# Patient Record
Sex: Female | Born: 1995 | Race: Black or African American | Hispanic: No | Smoking: Current some day smoker
Health system: Southern US, Community
[De-identification: ages and names within clinical notes are randomized; demographics above are authoritative.]

## PROBLEM LIST (undated history)

## (undated) ENCOUNTER — Inpatient Hospital Stay (HOSPITAL_COMMUNITY): Payer: Self-pay

## (undated) DIAGNOSIS — R519 Headache, unspecified: Secondary | ICD-10-CM

## (undated) DIAGNOSIS — J302 Other seasonal allergic rhinitis: Secondary | ICD-10-CM

## (undated) DIAGNOSIS — L309 Dermatitis, unspecified: Secondary | ICD-10-CM

## (undated) DIAGNOSIS — R51 Headache: Secondary | ICD-10-CM

## (undated) HISTORY — PX: HAND SURGERY: SHX662

---
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
## 2017-07-11 ENCOUNTER — Encounter (HOSPITAL_COMMUNITY): Payer: Self-pay | Admitting: Emergency Medicine

## 2017-07-11 ENCOUNTER — Ambulatory Visit (HOSPITAL_COMMUNITY)
Admission: EM | Admit: 2017-07-11 | Discharge: 2017-07-11 | Disposition: A | Discharge status: Home / Self Care | Payer: Medicaid Other | Attending: Family Medicine | Admitting: Family Medicine

## 2017-07-11 ENCOUNTER — Other Ambulatory Visit: Payer: Self-pay

## 2017-07-11 DIAGNOSIS — S80262A Insect bite (nonvenomous), left knee, initial encounter: Secondary | ICD-10-CM | POA: Diagnosis not present

## 2017-07-11 DIAGNOSIS — W57XXXA Bitten or stung by nonvenomous insect and other nonvenomous arthropods, initial encounter: Secondary | ICD-10-CM | POA: Diagnosis not present

## 2017-07-11 MED ORDER — MUPIROCIN CALCIUM 2 % NA OINT: TOPICAL_OINTMENT | NASAL | 0 refills | Status: DC

## 2017-07-11 NOTE — ED Provider Notes (Signed)
[facility]    CSN: [id number] Arrival date & time: 07/11/17  1919     History   Chief Complaint Chief Complaint  Patient presents with  . Knee Pain    HPI Stacy Mcgee is a 22 y.o. female.   Patient has localized swelling on the l dorsal lateral aspect of her left knee.  She suspects it is some kind of blighted I would agree  HPI  Past Medical History:  Diagnosis Date  . Asthma   . Eczema   . Generalized headaches   . Seasonal allergies     Patient Active Problem List   Diagnosis Date Noted  . Migraine without aura and without status migrainosus, not intractable 03/04/2015  . Tension headache 03/04/2015  . Sleeping difficulty 03/04/2015    History reviewed. No pertinent surgical history.  OB History   None      Home Medications    Prior to Admission medications   Medication Sig Start Date End Date Taking? Authorizing Provider  albuterol (PROVENTIL HFA;VENTOLIN HFA) 108 (90 BASE) MCG/ACT inhaler Inhale 2 puffs into the lungs every 6 (six) hours as needed for wheezing or shortness of breath.     [provider]  ibuprofen (ADVIL,MOTRIN) 800 MG tablet Take 1 tablet (800 mg total) by mouth every 8 (eight) hours as needed for mild pain or moderate pain. 05/17/16   Jacalyn Lefevre, MD  metroNIDAZOLE (FLAGYL) 500 MG tablet Take 1 tablet (500 mg total) by mouth 2 (two) times daily. 05/17/16   Jacalyn Lefevre, MD  oxyCODONE-acetaminophen (PERCOCET/ROXICET) 5-325 MG tablet Take 1 tablet by mouth every 6 (six) hours as needed for severe pain. 12/01/15   Lawyer, Cristal Deer, PA-C  triamcinolone ointment (KENALOG) 0.1 % Apply 1 application topically 2 (two) times daily as needed (for eczema).    [provider]    Family History Family History  Problem Relation Age of Onset  . Bipolar disorder Mother   . Depression Mother   . Migraines Father   . ADD / ADHD Sister   . Migraines Maternal Aunt   . Cancer Maternal Grandfather   . Heart  Problems Paternal Grandfather     Social History Social History   Tobacco Use  . Smoking status: Never Smoker  . Smokeless tobacco: Never Used  Substance Use Topics  . Alcohol use: No  . Drug use: No     Allergies   Peanut-containing drug products   Review of Systems Review of Systems  Constitutional: Negative for chills and fever.  HENT: Negative for ear pain and sore throat.   Eyes: Negative for pain and visual disturbance.  Respiratory: Negative for cough and shortness of breath.   Cardiovascular: Negative for chest pain and palpitations.  Gastrointestinal: Negative for abdominal pain and vomiting.  Genitourinary: Negative for dysuria and hematuria.  Musculoskeletal: Negative for arthralgias and back pain.  Skin: Positive for rash. Negative for color change.  Neurological: Negative for seizures and syncope.  All other systems reviewed and are negative.    Physical Exam Triage Vital Signs ED Triage Vitals  Enc Vitals Group     BP 07/11/17 2013 118/65     Pulse Rate 07/11/17 2013 79     Resp 07/11/17 2013 18     Temp 07/11/17 2013 98.2 F (36.8 C)     Temp Source 07/11/17 2013 Oral     SpO2 07/11/17 2013 100 %     Weight --      Height --  Head Circumference --      Peak Flow --      Pain Score 07/11/17 2011 7     Pain Loc --      Pain Edu? --      Excl. in GC? --    No data found.  Updated Vital Signs BP 118/65 (BP Location: Left Arm)   Pulse 79   Temp 98.2 F (36.8 C) (Oral)   Resp 18   SpO2 100%   Visual Acuity Right Eye Distance:   Left Eye Distance:   Bilateral Distance:    Right Eye Near:   Left Eye Near:    Bilateral Near:     Physical Exam  Constitutional: She appears well-developed and well-nourished.  Skin:  Localized swelling dorsal lateral knee tender to touch there is no drainage it does feel firm and not fluctuant.  I suspect this represents a spider bite     UC Treatments / Results  Labs (all labs ordered are  listed, but only abnormal results are displayed) Labs Reviewed - No data to display  EKG None Radiology No results found.  Procedures Procedures (including critical care time)  Medications Ordered in UC Medications - No data to display   Initial Impression / Assessment and Plan / UC Course  I have reviewed the triage vital signs and the nursing notes.  Pertinent labs & imaging results that were available during my care of the patient were reviewed by me and considered in my medical decision making (see chart for details).     Insect bite probable spider left knee will treat with topical antibiotic and have instructed her to use ice packs to reduce swelling  Final Clinical Impressions(s) / UC Diagnoses   Final diagnoses:  None    ED Discharge Orders    None       Controlled Substance Prescriptions [place] Controlled Substance Registry consulted? No   Frederica KusterMiller, Michella Detjen M, MD 07/11/17 2029

## 2017-07-11 NOTE — ED Triage Notes (Signed)
Swollen area to the lateral left knee/abscess.  Area first noticed 2 days ago

## 2017-09-15 ENCOUNTER — Encounter (HOSPITAL_COMMUNITY): Payer: Self-pay | Admitting: Emergency Medicine

## 2017-09-15 ENCOUNTER — Emergency Department (HOSPITAL_COMMUNITY): Payer: Medicaid Other

## 2017-09-15 ENCOUNTER — Emergency Department (HOSPITAL_COMMUNITY)
Admission: EM | Admit: 2017-09-15 | Discharge: 2017-09-15 | Disposition: A | Discharge status: Home / Self Care | Payer: Medicaid Other | Attending: Emergency Medicine | Admitting: Emergency Medicine

## 2017-09-15 DIAGNOSIS — Z79899 Other long term (current) drug therapy: Secondary | ICD-10-CM | POA: Insufficient documentation

## 2017-09-15 DIAGNOSIS — R51 Headache: Secondary | ICD-10-CM | POA: Diagnosis present

## 2017-09-15 DIAGNOSIS — Z91010 Allergy to peanuts: Secondary | ICD-10-CM | POA: Diagnosis not present

## 2017-09-15 DIAGNOSIS — G43909 Migraine, unspecified, not intractable, without status migrainosus: Secondary | ICD-10-CM | POA: Insufficient documentation

## 2017-09-15 DIAGNOSIS — J45909 Unspecified asthma, uncomplicated: Secondary | ICD-10-CM | POA: Insufficient documentation

## 2017-09-15 LAB — CBC
HCT: 41.2 % (ref 36.0–46.0)
Hemoglobin: 13.4 g/dL (ref 12.0–15.0)
MCH: 29.6 pg (ref 26.0–34.0)
MCHC: 32.5 g/dL (ref 30.0–36.0)
MCV: 90.9 fL (ref 78.0–100.0)
PLATELETS: 320 10*3/uL (ref 150–400)
RBC: 4.53 MIL/uL (ref 3.87–5.11)
RDW: 13.3 % (ref 11.5–15.5)
WBC: 7.8 10*3/uL (ref 4.0–10.5)

## 2017-09-15 LAB — BASIC METABOLIC PANEL
Anion gap: 8 (ref 5–15)
BUN: 8 mg/dL (ref 6–20)
CHLORIDE: 105 mmol/L (ref 101–111)
CO2: 25 mmol/L (ref 22–32)
CREATININE: 0.82 mg/dL (ref 0.44–1.00)
Calcium: 9.3 mg/dL (ref 8.9–10.3)
GFR calc Af Amer: >60 mL/min (ref >60)
Glucose, Bld: 86 mg/dL (ref 65–99)
Potassium: 3.4 mmol/L — ABNORMAL LOW (ref 3.5–5.1)
SODIUM: 138 mmol/L (ref 135–145)

## 2017-09-15 LAB — I-STAT BETA HCG BLOOD, ED (MC, WL, AP ONLY): I-stat hCG, quantitative: <5 m[IU]/mL (ref <5)

## 2017-09-15 MED ORDER — DEXAMETHASONE SODIUM PHOSPHATE 4 MG/ML IJ SOLN
4.0000 mg | Freq: Once | INTRAMUSCULAR | Status: AC
  Administered 2017-09-15: 4 mg via INTRAVENOUS
  Filled 2017-09-15: qty 1

## 2017-09-15 MED ORDER — ACETAMINOPHEN 500 MG PO TABS
1000.0000 mg | ORAL_TABLET | Freq: Once | ORAL | Status: AC
  Administered 2017-09-15: 1000 mg via ORAL
  Filled 2017-09-15: qty 2

## 2017-09-15 MED ORDER — SODIUM CHLORIDE 0.9 % IV BOLUS
1000.0000 mL | Freq: Once | INTRAVENOUS | Status: AC
  Administered 2017-09-15: 1000 mL via INTRAVENOUS

## 2017-09-15 MED ORDER — PROMETHAZINE HCL 25 MG/ML IJ SOLN
25.0000 mg | Freq: Once | INTRAMUSCULAR | Status: AC
  Administered 2017-09-15: 25 mg via INTRAVENOUS
  Filled 2017-09-15: qty 1

## 2017-09-15 MED ORDER — LORAZEPAM 2 MG/ML IJ SOLN: 0.5000 mg | INTRAMUSCULAR | Status: DC

## 2017-09-15 NOTE — ED Provider Notes (Signed)
[facility] Provider Note   CSN: [id number] Arrival date & time: 09/15/17  1753     History   Chief Complaint Chief Complaint  Patient presents with  . Migraine    HPI Stacy Mcgee is a 22 y.o. female with a past medical history of migraine headaches who presents today for evaluation of bilateral frontal headache/migraine with nausea.  This started while she was at work, she was not doing any particular strenuous or stressful activity, denies any trauma.  She was not bearing down, coughing, sneezing.  She reports that it rapidly worsened.  She reports that she has had migraines in the past however they feel different.  She has 9 out of 10 pain in her head.  She endorses shortness of breath but has been present since her head started hurting.  She reports feeling anxious.  HPI  Past Medical History:  Diagnosis Date  . Asthma   . Eczema   . Generalized headaches   . Seasonal allergies     Patient Active Problem List   Diagnosis Date Noted  . Migraine without aura and without status migrainosus, not intractable 03/04/2015  . Tension headache 03/04/2015  . Sleeping difficulty 03/04/2015    No past surgical history on file.   OB History   None      Home Medications    Prior to Admission medications   Medication Sig Start Date End Date Taking? Authorizing Provider  albuterol (PROVENTIL HFA;VENTOLIN HFA) 108 (90 BASE) MCG/ACT inhaler Inhale 2 puffs into the lungs every 6 (six) hours as needed for wheezing or shortness of breath.     [provider]  ibuprofen (ADVIL,MOTRIN) 800 MG tablet Take 1 tablet (800 mg total) by mouth every 8 (eight) hours as needed for mild pain or moderate pain. 05/17/16   Jacalyn LefevreHaviland, Julie, MD  metroNIDAZOLE (FLAGYL) 500 MG tablet Take 1 tablet (500 mg total) by mouth 2 (two) times daily. 05/17/16   Jacalyn LefevreHaviland, Julie, MD  mupirocin nasal ointment (BACTROBAN) 2 % Apply in each nostril daily 07/11/17    Frederica KusterMiller, Stephen M, MD  oxyCODONE-acetaminophen (PERCOCET/ROXICET) 5-325 MG tablet Take 1 tablet by mouth every 6 (six) hours as needed for severe pain. 12/01/15   Lawyer, Cristal Deerhristopher, PA-C  triamcinolone ointment (KENALOG) 0.1 % Apply 1 application topically 2 (two) times daily as needed (for eczema).    [provider]    Family History Family History  Problem Relation Age of Onset  . Bipolar disorder Mother   . Depression Mother   . Migraines Father   . ADD / ADHD Sister   . Migraines Maternal Aunt   . Cancer Maternal Grandfather   . Heart Problems Paternal Grandfather     Social History Social History   Tobacco Use  . Smoking status: Never Smoker  . Smokeless tobacco: Never Used  Substance Use Topics  . Alcohol use: No  . Drug use: No     Allergies   Peanut-containing drug products   Review of Systems Review of Systems  Constitutional: Negative for chills and fever.  HENT: Negative for congestion, ear pain and sore throat.   Eyes: Positive for visual disturbance. Negative for pain.  Respiratory: Positive for shortness of breath. Negative for cough and chest tightness.   Cardiovascular: Negative for chest pain and palpitations.  Gastrointestinal: Negative for abdominal pain and vomiting.  Genitourinary: Negative for dysuria and hematuria.  Musculoskeletal: Negative for arthralgias, back pain, neck pain and neck stiffness.  Skin: Negative for color change and rash.  Neurological: Positive for headaches. Negative for seizures and syncope.  All other systems reviewed and are negative.    Physical Exam Updated Vital Signs BP (!) 108/51   Pulse 66   Temp 97.7 F (36.5 C)   Resp 17   LMP 07/16/2017   SpO2 100%   Physical Exam  Constitutional: She is oriented to person, place, and time. She appears well-developed and well-nourished. No distress.  HENT:  Head: Normocephalic and atraumatic.  Eyes: Conjunctivae are normal. Right eye exhibits no  discharge. Left eye exhibits no discharge. No scleral icterus.  Neck: Normal range of motion. Neck supple.  Cardiovascular: Normal rate, regular rhythm and normal heart sounds.  Pulmonary/Chest: Effort normal and breath sounds normal. No stridor. No respiratory distress. She has no wheezes. She has no rales.  Abdominal: She exhibits no distension.  Musculoskeletal: She exhibits no edema or deformity.  Neurological: She is alert and oriented to person, place, and time. She exhibits normal muscle tone.  Mental Status:  Alert, oriented, thought content appropriate, able to give a coherent history. Speech fluent without evidence of aphasia. Able to follow 2 step commands without difficulty.  Cranial Nerves:  II:  Peripheral visual fields grossly normal, pupils equal, round, reactive to light III,IV, VI: ptosis not present, extra-ocular motions intact bilaterally  V,VII: smile symmetric, facial light touch sensation equal VIII: hearing grossly normal to voice  X: uvula elevates symmetrically  XI: bilateral shoulder shrug symmetric and strong XII: midline tongue extension without fassiculations Motor:  Normal tone. 5/5 in upper and lower extremities bilaterally including strong and equal grip strength and dorsiflexion/plantar flexion Cerebellar: normal finger-to-nose with bilateral upper extremities, normal heal shin with bilateral lower extremities.  CV: distal pulses palpable throughout    Skin: Skin is warm and dry. She is not diaphoretic.  Psychiatric: Her mood appears anxious.  Tearful  Nursing note and vitals reviewed.    ED Treatments / Results  Labs (all labs ordered are listed, but only abnormal results are displayed) Labs Reviewed  BASIC METABOLIC PANEL - Abnormal; Notable for the following components:      Result Value   Potassium 3.4 (*)    All other components within normal limits  CBC  I-STAT BETA HCG BLOOD, ED ([facility], [facility], [facility] ONLY)    EKG None  Radiology Ct Head Wo  Contrast  Result Date: 09/15/2017 CLINICAL DATA:  Headache. EXAM: CT HEAD WITHOUT CONTRAST TECHNIQUE: Contiguous axial images were obtained from the base of the skull through the vertex without intravenous contrast. COMPARISON:  CT scan of January 02, 2015. FINDINGS: Brain: No evidence of acute infarction, hemorrhage, hydrocephalus, extra-axial collection or mass lesion/mass effect. Vascular: No hyperdense vessel or unexpected calcification. Skull: Normal. Negative for fracture or focal lesion. Sinuses/Orbits: No acute finding. Other: None. IMPRESSION: Normal head CT. Electronically Signed   By: Lupita Raider, M.D.   On: 09/15/2017 19:38    Procedures Procedures (including critical care time)  Medications Ordered in ED Medications  sodium chloride 0.9 % bolus 1,000 mL (0 mLs Intravenous Stopped 09/15/17 2009)  dexamethasone (DECADRON) injection 4 mg (4 mg Intravenous Given 09/15/17 1918)  promethazine (PHENERGAN) injection 25 mg (25 mg Intravenous Given 09/15/17 1915)  acetaminophen (TYLENOL) tablet 1,000 mg (1,000 mg Oral Given 09/15/17 1917)     Initial Impression / Assessment and Plan / ED Course  I have reviewed the triage vital signs and the nursing notes.  Pertinent labs & imaging results  that were available during my care of the patient were reviewed by me and considered in my medical decision making (see chart for details).  Clinical Course as of Sep 16 2023  Sun Sep 15, 2017  2018 Patient reevaluated, she reports her headache is now a 2 out of 10, she feels much better.  She reports that as her headache went away less anxious and her shortness of breath went away and has not fully resolved.  She was requesting discharge home at this time.   [EH]    Clinical Course User Index [EH] Cristina Gong, PA-C   Patient presents today for evaluation of a migraine headache.  She has a history of migraine headaches, however reports that this is different than usual, and she has not had  a migraine in multiple years.  She initially reported shortness of breath and feeling very anxious.  Suspect shortness of breath secondary to anxiety.  Discussed the role of CT scan with the patient who wished to proceed CT scan.  CT scan obtained without acute abnormalities.  She was treated with migraine cocktail of IV fluids, Phenergan, Decadron, Tylenol, after which she reported that her pain went down to a 2 and she was ready to go home.  She did report some slight blurred vision, however visual acuity was normal.  Shortness of breath fully resolved as her pain decreased.  Return precautions were discussed with patient states understanding.  PCP follow-up.   Final Clinical Impressions(s) / ED Diagnoses   Final diagnoses:  Migraine without status migrainosus, not intractable, unspecified migraine type    ED Discharge Orders    None       Norman Clay 09/15/17 2025    Vanetta Mulders, MD 09/18/17 [phone]

## 2017-09-15 NOTE — ED Notes (Signed)
Patient transported to CT 

## 2017-09-15 NOTE — ED Notes (Signed)
Pt ambulated to restroom without difficulty

## 2017-09-15 NOTE — ED Notes (Signed)
ED Provider at bedside. 

## 2017-09-15 NOTE — ED Triage Notes (Addendum)
Pt states she began having a migraine to bilateral front of head at work and behind eyes. Pt also reports nausea. Hx of migraines, has not had one in a while. Ambulatory. Pt tearful at triage. Also endorses shortness of breath, lung sounds are clear to all lobes. Pain 9/10 to head. Denies pain anywhere else. LMP 2 months ago.

## 2017-12-19 ENCOUNTER — Encounter (HOSPITAL_COMMUNITY): Payer: Self-pay

## 2017-12-19 ENCOUNTER — Other Ambulatory Visit: Payer: Self-pay

## 2017-12-19 ENCOUNTER — Emergency Department (HOSPITAL_COMMUNITY)
Admission: EM | Admit: 2017-12-19 | Discharge: 2017-12-19 | Disposition: A | Discharge status: Home / Self Care | Payer: Medicaid Other | Attending: Emergency Medicine | Admitting: Emergency Medicine

## 2017-12-19 DIAGNOSIS — L03011 Cellulitis of right finger: Secondary | ICD-10-CM | POA: Insufficient documentation

## 2017-12-19 DIAGNOSIS — Z91010 Allergy to peanuts: Secondary | ICD-10-CM | POA: Insufficient documentation

## 2017-12-19 DIAGNOSIS — J45909 Unspecified asthma, uncomplicated: Secondary | ICD-10-CM | POA: Insufficient documentation

## 2017-12-19 MED ORDER — AMOXICILLIN-POT CLAVULANATE 875-125 MG PO TABS: 1.0000 | ORAL_TABLET | Freq: Two times a day (BID) | ORAL | 0 refills | Status: DC

## 2017-12-19 MED ORDER — LIDOCAINE HCL (PF) 1 % IJ SOLN
10.0000 mL | Freq: Once | INTRAMUSCULAR | Status: AC
  Administered 2017-12-19: 10 mL via INTRADERMAL
  Filled 2017-12-19: qty 10

## 2017-12-19 NOTE — ED Notes (Signed)
Family at bedside. 

## 2017-12-19 NOTE — Discharge Instructions (Addendum)
Contact a health care provider if: °Your symptoms get worse or do not improve with treatment. °You have a fever or chills. °You have redness spreading from the affected area. °You have continued or increased fluid, blood, or pus coming from the affected area. °Your finger or knuckle becomes swollen or is difficult to move. °

## 2017-12-19 NOTE — ED Triage Notes (Signed)
Pt states hx of infection in fingers in past, presents with swelling to right ring finger, and right middle finger with some drainage. Pt does bite nails.

## 2017-12-19 NOTE — ED Provider Notes (Signed)
[facility] EMERGENCY DEPARTMENT Provider Note   CSN: [id number] Arrival date & time: 12/19/17  1800     History   Chief Complaint Chief Complaint  Patient presents with  . Hand Pain    HPI Stacy Mcgee is a 22 y.o. female.  Who presents for evaluation of finger pain.  Patient states that she had swelling and some pus drainage from her right ring finger.  She states that she has had this before in the past usually from biting her nails.  She is also starting to experience some in her middle finger on the right hand as well.  She denies fevers or chills.  Complains of throbbing nonradiating pain.  HPI  Past Medical History:  Diagnosis Date  . Asthma   . Eczema   . Generalized headaches   . Seasonal allergies     Patient Active Problem List   Diagnosis Date Noted  . Migraine without aura and without status migrainosus, not intractable 03/04/2015  . Tension headache 03/04/2015  . Sleeping difficulty 03/04/2015    History reviewed. No pertinent surgical history.   OB History   None      Home Medications    Prior to Admission medications   Medication Sig Start Date End Date Taking? Authorizing Provider  albuterol (PROVENTIL HFA;VENTOLIN HFA) 108 (90 BASE) MCG/ACT inhaler Inhale 2 puffs into the lungs every 6 (six) hours as needed for wheezing or shortness of breath.     [provider]  ibuprofen (ADVIL,MOTRIN) 800 MG tablet Take 1 tablet (800 mg total) by mouth every 8 (eight) hours as needed for mild pain or moderate pain. 05/17/16   Jacalyn Lefevre, MD  metroNIDAZOLE (FLAGYL) 500 MG tablet Take 1 tablet (500 mg total) by mouth 2 (two) times daily. 05/17/16   Jacalyn Lefevre, MD  mupirocin nasal ointment (BACTROBAN) 2 % Apply in each nostril daily 07/11/17   Frederica Kuster, MD  oxyCODONE-acetaminophen (PERCOCET/ROXICET) 5-325 MG tablet Take 1 tablet by mouth every 6 (six) hours as needed for severe pain. 12/01/15   Lawyer, Cristal Deer,  PA-C  triamcinolone ointment (KENALOG) 0.1 % Apply 1 application topically 2 (two) times daily as needed (for eczema).    [provider]    Family History Family History  Problem Relation Age of Onset  . Bipolar disorder Mother   . Depression Mother   . Migraines Father   . ADD / ADHD Sister   . Migraines Maternal Aunt   . Cancer Maternal Grandfather   . Heart Problems Paternal Grandfather     Social History Social History   Tobacco Use  . Smoking status: Never Smoker  . Smokeless tobacco: Never Used  Substance Use Topics  . Alcohol use: No  . Drug use: No     Allergies   Peanut-containing drug products   Review of Systems Review of Systems  Constitutional: Negative for chills and fever.  Musculoskeletal: Negative for joint swelling.  Skin: Negative for wound.       Swelling and erythema of the finger  Neurological: Negative for numbness.     Physical Exam Updated Vital Signs BP 116/70 (BP Location: Right Arm)   Pulse 88   Temp 98.3 F (36.8 C) (Oral)   Resp 18   Ht 5\' 3"  (1.6 m)   Wt 81.6 kg   SpO2 100%   BMI 31.89 kg/m   Physical Exam  Constitutional: She is oriented to person, place, and time. She appears well-developed and well-nourished.  No distress.  HENT:  Head: Normocephalic and atraumatic.  Eyes: Conjunctivae are normal. No scleral icterus.  Neck: Normal range of motion.  Cardiovascular: Normal rate, regular rhythm and normal heart sounds. Exam reveals no gallop and no friction rub.  No murmur heard. Pulmonary/Chest: Effort normal and breath sounds normal. No respiratory distress.  Abdominal: Soft. Bowel sounds are normal. She exhibits no distension and no mass. There is no tenderness. There is no guarding.  Musculoskeletal:  Right ring finger with exquisite tenderness and fluctuance on the ulnar side of the nail margin  Neurological: She is alert and oriented to person, place, and time.  Skin: Skin is warm and dry. She is not  diaphoretic.  Psychiatric: Her behavior is normal.  Nursing note and vitals reviewed.    ED Treatments / Results  Labs (all labs ordered are listed, but only abnormal results are displayed) Labs Reviewed - No data to display  EKG None  Radiology No results found.  Procedures Procedures (including critical care time) INCISION AND DRAINAGE Performed by: Arthor Captain Consent: Verbal consent obtained. Risks and benefits: risks, benefits and alternatives were discussed Type: abscess  Body area: R ring finger  Anesthesia: local infiltration  Incision was made with a scalpel.  Digital block: lidocaine 1% without epinephrine  Anesthetic total: 8 ml  Complexity: complex Blunt dissection to break up loculations  Drainage: purulent  Drainage amount: moderate   Patient tolerance: Patient tolerated the procedure well with no immediate complications.    Medications Ordered in ED Medications  lidocaine (PF) (XYLOCAINE) 1 % injection 10 mL (10 mLs Intradermal Given 12/19/17 2105)     Initial Impression / Assessment and Plan / ED Course  I have reviewed the triage vital signs and the nursing notes.  Pertinent labs & imaging results that were available during my care of the patient were reviewed by me and considered in my medical decision making (see chart for details).     Patient with paronychia amenable to incision and drainage.    Encouraged home warm soaks and flushing.  No signs of cellulitis is surrounding skin.  No evidence of felon or infectious tenosynovitis.  Will d/c to home.  No antibiotic therapy is indicated.   Final Clinical Impressions(s) / ED Diagnoses   Final diagnoses:  None    ED Discharge Orders    None       Arthor Captain, PA-C 12/20/17 0046    Mancel Bale, MD 12/20/17 1500

## 2018-02-03 ENCOUNTER — Emergency Department (HOSPITAL_COMMUNITY): Payer: Medicaid Other

## 2018-02-03 ENCOUNTER — Other Ambulatory Visit: Payer: Self-pay

## 2018-02-03 ENCOUNTER — Emergency Department (HOSPITAL_COMMUNITY)
Admission: EM | Admit: 2018-02-03 | Discharge: 2018-02-03 | Disposition: A | Discharge status: Home / Self Care | Payer: Medicaid Other | Attending: Emergency Medicine | Admitting: Emergency Medicine

## 2018-02-03 ENCOUNTER — Encounter (HOSPITAL_COMMUNITY): Payer: Self-pay | Admitting: Neurology

## 2018-02-03 DIAGNOSIS — G43909 Migraine, unspecified, not intractable, without status migrainosus: Secondary | ICD-10-CM

## 2018-02-03 DIAGNOSIS — Z79899 Other long term (current) drug therapy: Secondary | ICD-10-CM | POA: Insufficient documentation

## 2018-02-03 DIAGNOSIS — F17210 Nicotine dependence, cigarettes, uncomplicated: Secondary | ICD-10-CM | POA: Insufficient documentation

## 2018-02-03 DIAGNOSIS — R079 Chest pain, unspecified: Secondary | ICD-10-CM | POA: Insufficient documentation

## 2018-02-03 DIAGNOSIS — J45909 Unspecified asthma, uncomplicated: Secondary | ICD-10-CM | POA: Insufficient documentation

## 2018-02-03 LAB — BASIC METABOLIC PANEL
ANION GAP: 8 (ref 5–15)
BUN: 8 mg/dL (ref 6–20)
CALCIUM: 9.1 mg/dL (ref 8.9–10.3)
CO2: 25 mmol/L (ref 22–32)
Chloride: 104 mmol/L (ref 98–111)
Creatinine, Ser: 0.75 mg/dL (ref 0.44–1.00)
Glucose, Bld: 84 mg/dL (ref 70–99)
Potassium: 3.6 mmol/L (ref 3.5–5.1)
Sodium: 137 mmol/L (ref 135–145)

## 2018-02-03 LAB — I-STAT BETA HCG BLOOD, ED (MC, WL, AP ONLY)

## 2018-02-03 LAB — CBC
HCT: 43.5 % (ref 36.0–46.0)
Hemoglobin: 13.4 g/dL (ref 12.0–15.0)
MCH: 28 pg (ref 26.0–34.0)
MCHC: 30.8 g/dL (ref 30.0–36.0)
MCV: 90.8 fL (ref 80.0–100.0)
NRBC: 0 % (ref 0.0–0.2)
PLATELETS: 423 10*3/uL — AB (ref 150–400)
RBC: 4.79 MIL/uL (ref 3.87–5.11)
RDW: 13.9 % (ref 11.5–15.5)
WBC: 7.7 10*3/uL (ref 4.0–10.5)

## 2018-02-03 LAB — I-STAT TROPONIN, ED: TROPONIN I, POC: 0 ng/mL (ref 0.00–0.08)

## 2018-02-03 MED ORDER — KETOROLAC TROMETHAMINE 30 MG/ML IJ SOLN
30.0000 mg | Freq: Once | INTRAMUSCULAR | Status: AC
  Administered 2018-02-03: 30 mg via INTRAVENOUS
  Filled 2018-02-03: qty 1

## 2018-02-03 MED ORDER — DIPHENHYDRAMINE HCL 50 MG/ML IJ SOLN
25.0000 mg | Freq: Once | INTRAMUSCULAR | Status: AC
  Administered 2018-02-03: 25 mg via INTRAVENOUS
  Filled 2018-02-03: qty 1

## 2018-02-03 MED ORDER — DEXAMETHASONE SODIUM PHOSPHATE 10 MG/ML IJ SOLN
10.0000 mg | Freq: Once | INTRAMUSCULAR | Status: AC
  Administered 2018-02-03: 10 mg via INTRAVENOUS
  Filled 2018-02-03: qty 1

## 2018-02-03 MED ORDER — METOCLOPRAMIDE HCL 5 MG/ML IJ SOLN
10.0000 mg | Freq: Once | INTRAMUSCULAR | Status: AC
  Administered 2018-02-03: 10 mg via INTRAVENOUS
  Filled 2018-02-03: qty 2

## 2018-02-03 NOTE — ED Triage Notes (Addendum)
Pt reports h/a and chest tightness since she woke up this morning at 8 am. Has hx of migraines and asthma. H/a is similar to h/a in past, and pain is behind her eyes. Has been a month since last h/a. She took ibuprofen this morning.

## 2018-02-03 NOTE — ED Notes (Signed)
Patient left at this time with all belongings. 

## 2018-02-03 NOTE — Discharge Instructions (Signed)
Follow-up with your primary care provider.  Return to ER for severe concerning symptoms.

## 2018-02-03 NOTE — ED Provider Notes (Signed)
[facility] EMERGENCY DEPARTMENT Provider Note   CSN: [id number] Arrival date & time: 02/03/18  1031     History   Chief Complaint Chief Complaint  Patient presents with  . Headache  . Chest Pain    HPI AIZZA SANTIAGO is a 22 y.o. female.  22 year old female presents with complaint of headache and chest pressure.  Patient states her symptoms started at 6 AM this morning upon waking, headache is located behind her eyes, similar to previous headaches, last headache similar to this was 1 month ago.  Patient states that her chest feels tight, does not feel like prior asthma symptoms, denies cough, congestion, wheezing.  Patient is out of her albuterol inhaler and has not tried using this today.  Chest tightness is constant, worse with lying on her right side, no change with exertion. Patient is a daily smoker, no history of HTN/cholesterol/DM, no family cardiac history. No other complaints or concerns.      Past Medical History:  Diagnosis Date  . Asthma   . Eczema   . Generalized headaches   . Seasonal allergies     Patient Active Problem List   Diagnosis Date Noted  . Migraine without aura and without status migrainosus, not intractable 03/04/2015  . Tension headache 03/04/2015  . Sleeping difficulty 03/04/2015    History reviewed. No pertinent surgical history.   OB History   None      Home Medications    Prior to Admission medications   Medication Sig Start Date End Date Taking? Authorizing Provider  ibuprofen (ADVIL,MOTRIN) 800 MG tablet Take 1 tablet (800 mg total) by mouth every 8 (eight) hours as needed for mild pain or moderate pain. Patient taking differently: Take 600 mg by mouth every 8 (eight) hours as needed for mild pain or moderate pain.  05/17/16  Yes Jacalyn Lefevre, MD    Family History Family History  Problem Relation Age of Onset  . Bipolar disorder Mother   . Depression Mother   . Migraines Father   . ADD / ADHD  Sister   . Migraines Maternal Aunt   . Cancer Maternal Grandfather   . Heart Problems Paternal Grandfather     Social History Social History   Tobacco Use  . Smoking status: Current Every Day Smoker  . Smokeless tobacco: Never Used  Substance Use Topics  . Alcohol use: No  . Drug use: No     Allergies   Peanut-containing drug products   Review of Systems Review of Systems  Constitutional: Negative for chills, diaphoresis and fever.  HENT: Negative for congestion.   Eyes: Negative for visual disturbance.  Respiratory: Positive for chest tightness. Negative for cough, shortness of breath and wheezing.   Cardiovascular: Positive for chest pain. Negative for palpitations and leg swelling.  Gastrointestinal: Negative for abdominal pain, nausea and vomiting.  Musculoskeletal: Negative for back pain and neck pain.  Skin: Negative for rash and wound.  Allergic/Immunologic: Negative for immunocompromised state.  Neurological: Positive for headaches. Negative for weakness.  Psychiatric/Behavioral: Negative for confusion.  All other systems reviewed and are negative.    Physical Exam Updated Vital Signs BP 116/67   Pulse 61   Temp 98.2 F (36.8 C) (Oral)   Resp 20   Ht 5\' 3"  (1.6 m)   Wt 77.6 kg   LMP 01/14/2018   SpO2 100%   BMI 30.29 kg/m   Physical Exam  Constitutional: She is oriented to person, place, and time. She appears  well-developed and well-nourished. No distress.  HENT:  Head: Normocephalic and atraumatic.  Eyes: Pupils are equal, round, and reactive to light. EOM are normal.  Neck: Neck supple.  Cardiovascular: Normal rate, regular rhythm, normal heart sounds and intact distal pulses.  No murmur heard. Pulmonary/Chest: Effort normal and breath sounds normal. She has no decreased breath sounds. She exhibits tenderness.    Abdominal: Soft. There is no tenderness.  Neurological: She is alert and oriented to person, place, and time. She has normal  strength. GCS eye subscore is 4. GCS verbal subscore is 5. GCS motor subscore is 6.  Skin: Skin is warm and dry. She is not diaphoretic.  Psychiatric: She has a normal mood and affect. Her behavior is normal.  Nursing note and vitals reviewed.    ED Treatments / Results  Labs (all labs ordered are listed, but only abnormal results are displayed) Labs Reviewed  CBC - Abnormal; Notable for the following components:      Result Value   Platelets 423 (*)    All other components within normal limits  BASIC METABOLIC PANEL  I-STAT TROPONIN, ED  I-STAT BETA HCG BLOOD, ED ([facility], [facility], [facility] ONLY)    EKG EKG Interpretation  Date/Time:  Monday February 03 2018 10:38:40 EST Ventricular Rate:  64 PR Interval:    QRS Duration: 98 QT Interval:  412 QTC Calculation: 426 R Axis:   93 Text Interpretation:  Sinus arrhythmia Borderline right axis deviation no prior available for comparison Confirmed by Tilden Fossa [phone]) on 02/03/2018 10:41:05 AM   Radiology Dg Chest Port 1 View  Result Date: 02/03/2018 CLINICAL DATA:  Migraine headache, nausea, shortness of breath beginning early today. History of asthma. EXAM: PORTABLE CHEST 1 VIEW COMPARISON:  Portable chest x-ray of July 22, 2013 FINDINGS: The lungs are adequately inflated. There is no focal infiltrate. There is no pleural effusion. The heart is top-normal in size. The pulmonary vascularity is normal. The bony thorax exhibits no acute abnormality. IMPRESSION: There is no active cardiopulmonary disease. Electronically Signed   By: David  [place] M.D.   On: 02/03/2018 11:22    Procedures Procedures (including critical care time)  Medications Ordered in ED Medications  ketorolac (TORADOL) 30 MG/ML injection 30 mg (30 mg Intravenous Given 02/03/18 1234)  metoCLOPramide (REGLAN) injection 10 mg (10 mg Intravenous Given 02/03/18 1235)  dexamethasone (DECADRON) injection 10 mg (10 mg Intravenous Given 02/03/18 1241)  diphenhydrAMINE  (BENADRYL) injection 25 mg (25 mg Intravenous Given 02/03/18 1238)     Initial Impression / Assessment and Plan / ED Course  I have reviewed the triage vital signs and the nursing notes.  Pertinent labs & imaging results that were available during my care of the patient were reviewed by me and considered in my medical decision making (see chart for details).  Clinical Course as of Feb 04 1312  Mon Feb 03, 2018  6210 22 year old female presents with complaint of headache typical of her previous migraines as well as chest tightness.  On exam patient had palpation of her sternum, exam otherwise unremarkable.  EKG unremarkable, CBC, BMP, troponin and hCG all within normal limits are negative.  Chest x-ray unremarkable.  Patient states her chest tightness has resolved, continues to have her migraine.  Medication ordered for her migraine.   [LM]  1312 Migraine cocktail given, patient reports her symptoms have totally resolved and she is ready for discharge at this time.  Advised patient to follow-up with your primary care provider, return to ER  for any severe concerning symptoms.  Offered to refill patient's albuterol inhaler as she previously stated she is out.  Patient states that she does have a few puffs left on her inhaler and does have refills available.   [LM]    Clinical Course User Index [LM] Jeannie Fend, PA-C   Final Clinical Impressions(s) / ED Diagnoses   Final diagnoses:  Migraine without status migrainosus, not intractable, unspecified migraine type  Nonspecific chest pain    ED Discharge Orders    None       Jeannie Fend, PA-C 02/03/18 1313    Tilden Fossa, MD 02/04/18 [phone]

## 2018-05-20 ENCOUNTER — Encounter (HOSPITAL_COMMUNITY): Payer: Self-pay

## 2018-05-20 ENCOUNTER — Ambulatory Visit (HOSPITAL_COMMUNITY)
Admission: EM | Admit: 2018-05-20 | Discharge: 2018-05-20 | Disposition: A | Discharge status: Home / Self Care | Payer: Self-pay | Attending: Family Medicine | Admitting: Family Medicine

## 2018-05-20 ENCOUNTER — Ambulatory Visit (INDEPENDENT_AMBULATORY_CARE_PROVIDER_SITE_OTHER): Payer: Self-pay

## 2018-05-20 DIAGNOSIS — R0989 Other specified symptoms and signs involving the circulatory and respiratory systems: Secondary | ICD-10-CM

## 2018-05-20 DIAGNOSIS — R05 Cough: Secondary | ICD-10-CM

## 2018-05-20 DIAGNOSIS — J4521 Mild intermittent asthma with (acute) exacerbation: Secondary | ICD-10-CM

## 2018-05-20 MED ORDER — PREDNISONE 20 MG PO TABS: 40.0000 mg | ORAL_TABLET | Freq: Every day | ORAL | 0 refills | Status: AC

## 2018-05-20 MED ORDER — ALBUTEROL SULFATE HFA 108 (90 BASE) MCG/ACT IN AERS: 1.0000 | INHALATION_SPRAY | Freq: Four times a day (QID) | RESPIRATORY_TRACT | 0 refills | Status: DC | PRN

## 2018-05-20 MED ORDER — BENZONATATE 100 MG PO CAPS: 100.0000 mg | ORAL_CAPSULE | Freq: Three times a day (TID) | ORAL | 0 refills | Status: DC

## 2018-05-20 NOTE — ED Triage Notes (Signed)
Pt presents with asthma flare up because of an upper respiratory cold she has; pt has complaint of non productive cough, chest tightness and shortness of breath X 7 days with the worse being between yesterday and today.

## 2018-05-20 NOTE — ED Provider Notes (Signed)
Patient: Stacy Mcgee MRN: [id number] DOB: [birth year] PCP: Iona Hansen, NP     Subjective:  Chief Complaint  Patient presents with  . Cough  . Chest Pain  . Shortness of Breath    HPI: The patient is a 23 y.o. female who presents today for cough/shortness of breath. She started out with a cold, but the past 5 days her cough has gotten worse and today her chest was tight and she decided to come in. She does have a history of asthma and only gets a flair when she gets a cold. She does have a rescue inhaler and has been needing it every 2 hours. She does not have a nebulizer. She has not had fever/chills. Her cough is productive in nature and has a yellow color to it. She has both shortness of breath and wheezing. Her little cousin was sick with a 24 hour fever, but no cold symptoms. She does smoke cigarettes. She has taken no other medication.   Lmp: 05/05/2018  Review of Systems  Constitutional: Negative for chills and fever.  HENT: Negative for congestion, ear pain, postnasal drip, rhinorrhea, sinus pressure, sinus pain and sore throat.   Respiratory: Positive for cough, chest tightness, shortness of breath and wheezing.   Cardiovascular: Negative for chest pain and palpitations.  Gastrointestinal: Negative for abdominal pain, nausea and vomiting.    Allergies Patient is allergic to peanut-containing drug products.  Past Medical History Patient  has a past medical history of Asthma, Eczema, Generalized headaches, and Seasonal allergies.  Surgical History Patient  has no past surgical history on file.  Family History Pateint's family history includes ADD / ADHD in her sister; Bipolar disorder in her mother; Cancer in her maternal grandfather; Depression in her mother; Heart Problems in her paternal grandfather; Migraines in her father and maternal aunt.  Social History Patient  reports that she has been smoking. She has never used smokeless tobacco. She reports that  she does not drink alcohol or use drugs.    Objective: Vitals:   05/20/18 1822  BP: 116/70  Pulse: 85  Resp: 16  Temp: 98.7 F (37.1 C)  TempSrc: Oral  SpO2: 100%    There is no height or weight on file to calculate BMI.  Physical Exam Vitals signs reviewed.  Constitutional:      Appearance: She is obese.     Comments: Tobacco abuse   Neck:     Musculoskeletal: Normal range of motion and neck supple.  Cardiovascular:     Rate and Rhythm: Normal rate and regular rhythm.     Heart sounds: Normal heart sounds. No systolic murmur.  Pulmonary:     Effort: Pulmonary effort is normal.     Breath sounds: Normal breath sounds. No wheezing, rhonchi or rales.  Lymphadenopathy:     Cervical: No cervical adenopathy.  Skin:    General: Skin is warm and dry.     Capillary Refill: Capillary refill takes less than 2 seconds.  Neurological:     General: No focal deficit present.     Mental Status: She is alert and oriented to person, place, and time.    CXR: normal     Assessment/plan: The encounter diagnosis was Mild intermittent asthma with acute exacerbation. -steroid burst, inhaler scheduled for next 1-2 days then prn and tessalon pearls prn. counseled her on importance of smoking cessation especially with history of asthma. She is to rest, push fluids. Any increasing shortness of breath, fever, decreased air movement,  retractions, nasal flaring she is to go to ER.     Orland Mustard, MD  05/20/2018    Orland Mustard, MD 05/20/18 [phone]

## 2018-09-08 ENCOUNTER — Other Ambulatory Visit: Payer: Self-pay

## 2018-09-08 ENCOUNTER — Ambulatory Visit (HOSPITAL_COMMUNITY)
Admission: EM | Admit: 2018-09-08 | Discharge: 2018-09-08 | Disposition: A | Discharge status: Home / Self Care | Payer: Medicaid Other | Attending: Family Medicine | Admitting: Family Medicine

## 2018-09-08 ENCOUNTER — Encounter (HOSPITAL_COMMUNITY): Payer: Self-pay

## 2018-09-08 DIAGNOSIS — M62838 Other muscle spasm: Secondary | ICD-10-CM

## 2018-09-08 MED ORDER — CYCLOBENZAPRINE HCL 10 MG PO TABS: 10.0000 mg | ORAL_TABLET | Freq: Two times a day (BID) | ORAL | 0 refills | Status: DC | PRN

## 2018-09-08 MED ORDER — KETOROLAC TROMETHAMINE 30 MG/ML IJ SOLN
INTRAMUSCULAR | Status: AC
  Filled 2018-09-08: qty 1

## 2018-09-08 MED ORDER — KETOROLAC TROMETHAMINE 30 MG/ML IJ SOLN
30.0000 mg | Freq: Once | INTRAMUSCULAR | Status: AC
  Administered 2018-09-08: 30 mg via INTRAMUSCULAR

## 2018-09-08 NOTE — Discharge Instructions (Addendum)
Take the Flexeril as needed for muscle spasm.  Be aware this medication will make you drowsy.  Do not drive on this medication Toradol shot given here for pain and inflammation Heat/ice may help along with gentle stretching

## 2018-09-08 NOTE — ED Triage Notes (Signed)
Patient presents to Urgent Care with complaints of neck pain that radiates down her right shoulder since reaching for something at work the day before yesterday. Patient reports the pains are sharp and intermittent.

## 2018-09-08 NOTE — ED Provider Notes (Signed)
[facility]    CSN: [id number] Arrival date & time: 09/08/18  1235     History   Chief Complaint Chief Complaint  Patient presents with  . Appointment    12:50  . Neck Pain    HPI Stacy Mcgee is a 23 y.o. female.   Patient is a 23 year old female who presents today with right upper back, neck discomfort.  This is been present since yesterday.  She reports reaching up for something at work 2 days ago and felt a pull in her upper back, neck area.  The pain is sharp/intermittent and worse with certain movements.  She took ibuprofen yesterday without much relief.  She has not taken any medication today.  Denies any fever, chills.  Denies any injury to the neck.  ROS per HPI      Past Medical History:  Diagnosis Date  . Asthma   . Eczema   . Generalized headaches   . Seasonal allergies     Patient Active Problem List   Diagnosis Date Noted  . Migraine without aura and without status migrainosus, not intractable 03/04/2015  . Tension headache 03/04/2015  . Sleeping difficulty 03/04/2015    History reviewed. No pertinent surgical history.  OB History   No obstetric history on file.      Home Medications    Prior to Admission medications   Medication Sig Start Date End Date Taking? Authorizing Provider  albuterol (PROVENTIL HFA;VENTOLIN HFA) 108 (90 Base) MCG/ACT inhaler Inhale 1-2 puffs into the lungs every 6 (six) hours as needed for wheezing or shortness of breath. 05/20/18  Yes Orland MustardWolfe, Allison, MD  benzonatate (TESSALON) 100 MG capsule Take 1 capsule (100 mg total) by mouth every 8 (eight) hours. 05/20/18   Orland MustardWolfe, Allison, MD  cyclobenzaprine (FLEXERIL) 10 MG tablet Take 1 tablet (10 mg total) by mouth 2 (two) times daily as needed for muscle spasms. 09/08/18   Dahlia ByesBast, Kenika Sahm A, NP  ibuprofen (ADVIL,MOTRIN) 800 MG tablet Take 1 tablet (800 mg total) by mouth every 8 (eight) hours as needed for mild pain or moderate pain. Patient taking differently:  Take 600 mg by mouth every 8 (eight) hours as needed for mild pain or moderate pain.  05/17/16   Jacalyn LefevreHaviland, Julie, MD    Family History Family History  Problem Relation Age of Onset  . Bipolar disorder Mother   . Depression Mother   . Migraines Father   . ADD / ADHD Sister   . Migraines Maternal Aunt   . Cancer Maternal Grandfather   . Heart Problems Paternal Grandfather     Social History Social History   Tobacco Use  . Smoking status: Current Every Day Smoker    Types: Cigarettes  . Smokeless tobacco: Never Used  . Tobacco comment: 4 cigarettes per day  Substance Use Topics  . Alcohol use: No  . Drug use: No     Allergies   Peanut-containing drug products   Review of Systems Review of Systems   Physical Exam Triage Vital Signs ED Triage Vitals  Enc Vitals Group     BP 09/08/18 1256 113/67     Pulse Rate 09/08/18 1256 64     Resp 09/08/18 1256 17     Temp 09/08/18 1256 98.3 F (36.8 C)     Temp Source 09/08/18 1256 Oral     SpO2 09/08/18 1256 99 %     Weight --      Height --  Head Circumference --      Peak Flow --      Pain Score 09/08/18 1253 6     Pain Loc --      Pain Edu? --      Excl. in [place]? --    No data found.  Updated Vital Signs BP 113/67 (BP Location: Left Arm)   Pulse 64   Temp 98.3 F (36.8 C) (Oral)   Resp 17   LMP 08/19/2018 (Exact Date)   SpO2 99%   Visual Acuity Right Eye Distance:   Left Eye Distance:   Bilateral Distance:    Right Eye Near:   Left Eye Near:    Bilateral Near:     Physical Exam Vitals signs and nursing note reviewed.  Constitutional:      Appearance: Normal appearance.  HENT:     Head: Normocephalic and atraumatic.  Eyes:     Conjunctiva/sclera: Conjunctivae normal.  Neck:     Musculoskeletal: Normal range of motion.     Comments: Good range of motion of the neck.  Mild swelling to the right trapezius. Pulmonary:     Effort: Pulmonary effort is normal.  Musculoskeletal: Normal range of  motion.     Comments: Tender to palpation over right trapezius and right lateral neck area. Mild swelling to right trapezius  Skin:    General: Skin is warm and dry.  Neurological:     General: No focal deficit present.     Mental Status: She is alert.  Psychiatric:        Mood and Affect: Mood normal.      UC Treatments / Results  Labs (all labs ordered are listed, but only abnormal results are displayed) Labs Reviewed - No data to display  EKG None  Radiology No results found.  Procedures Procedures (including critical care time)  Medications Ordered in UC Medications  ketorolac (TORADOL) 30 MG/ML injection 30 mg (has no administration in time range)    Initial Impression / Assessment and Plan / UC Course  I have reviewed the triage vital signs and the nursing notes.  Pertinent labs & imaging results that were available during my care of the patient were reviewed by me and considered in my medical decision making (see chart for details).     Symptoms consistent with muscle spasm of the trapezius. Flexeril given for muscle relaxant Toradol given here for pain and inflammation Follow up as needed for continued or worsening symptoms  Final Clinical Impressions(s) / UC Diagnoses   Final diagnoses:  Trapezius muscle spasm     Discharge Instructions     Take the Flexeril as needed for muscle spasm.  Be aware this medication will make you drowsy.  Do not drive on this medication Toradol shot given here for pain and inflammation Heat/ice may help along with gentle stretching    ED Prescriptions    Medication Sig Dispense Auth. Provider   cyclobenzaprine (FLEXERIL) 10 MG tablet Take 1 tablet (10 mg total) by mouth 2 (two) times daily as needed for muscle spasms. 20 tablet Loura Halt A, NP     Controlled Substance Prescriptions [place] Controlled Substance Registry consulted? Not Applicable   Orvan July, NP 09/08/18 1324

## 2018-09-20 ENCOUNTER — Encounter (HOSPITAL_COMMUNITY): Payer: Self-pay

## 2018-09-20 ENCOUNTER — Other Ambulatory Visit: Payer: Self-pay

## 2018-09-20 ENCOUNTER — Ambulatory Visit (HOSPITAL_COMMUNITY)
Admission: EM | Admit: 2018-09-20 | Discharge: 2018-09-20 | Disposition: A | Discharge status: Home / Self Care | Payer: Medicaid Other | Attending: Physician Assistant | Admitting: Physician Assistant

## 2018-09-20 DIAGNOSIS — G43019 Migraine without aura, intractable, without status migrainosus: Secondary | ICD-10-CM | POA: Diagnosis not present

## 2018-09-20 MED ORDER — DEXAMETHASONE SODIUM PHOSPHATE 10 MG/ML IJ SOLN
10.0000 mg | Freq: Once | INTRAMUSCULAR | Status: AC
  Administered 2018-09-20: 10 mg via INTRAMUSCULAR

## 2018-09-20 MED ORDER — KETOROLAC TROMETHAMINE 30 MG/ML IJ SOLN
30.0000 mg | Freq: Once | INTRAMUSCULAR | Status: AC
  Administered 2018-09-20: 30 mg via INTRAMUSCULAR

## 2018-09-20 MED ORDER — KETOROLAC TROMETHAMINE 30 MG/ML IJ SOLN
INTRAMUSCULAR | Status: AC
  Filled 2018-09-20: qty 1

## 2018-09-20 MED ORDER — DEXAMETHASONE SODIUM PHOSPHATE 10 MG/ML IJ SOLN
INTRAMUSCULAR | Status: AC
  Filled 2018-09-20: qty 1

## 2018-09-20 MED ORDER — METOCLOPRAMIDE HCL 5 MG/ML IJ SOLN
5.0000 mg | Freq: Once | INTRAMUSCULAR | Status: AC
  Administered 2018-09-20: 17:00:00 5 mg via INTRAMUSCULAR

## 2018-09-20 MED ORDER — METOCLOPRAMIDE HCL 5 MG/ML IJ SOLN
INTRAMUSCULAR | Status: AC
  Filled 2018-09-20: qty 2

## 2018-09-20 NOTE — Discharge Instructions (Signed)
Toradol, decadron, reglan injection in office today. Keep hydrated, urine should be clear to pale yellow in color. Continue to monitor symptoms, if experiencing sudden worsening of headache, confusion, vomiting, passing out, losing balance, go to the ED for further evaluation needed.

## 2018-09-20 NOTE — ED Provider Notes (Signed)
[facility]    CSN: [id number] Arrival date & time: 09/20/18  1555     History   Chief Complaint Chief Complaint  Patient presents with  . Headache    HPI Stacy Mcgee is a 23 y.o. female.   23 year old female comes in for acute onset of headache about 30 mins ago. States headache is right sided, throbbing in sensation. She has photophobia, nausea with the headache. She was dizzy under the sun, but dizziness has resolved since coming in. She has history of migraines, last one 2 months ago, does not take abortive medication. She did take ibuprofen 400mg  with mild relief. She denies recent head injury, loss of consciousness. Denies worse headache of her life. Thinks greasy food she ate prior to headache starting was the trigger. Denies URI symptoms such as cough, congestion, sore throat. Denies fever, chills, night sweats.      Past Medical History:  Diagnosis Date  . Asthma   . Eczema   . Generalized headaches   . Seasonal allergies     Patient Active Problem List   Diagnosis Date Noted  . Migraine without aura and without status migrainosus, not intractable 03/04/2015  . Tension headache 03/04/2015  . Sleeping difficulty 03/04/2015    History reviewed. No pertinent surgical history.  OB History   No obstetric history on file.      Home Medications    Prior to Admission medications   Medication Sig Start Date End Date Taking? Authorizing Provider  albuterol (PROVENTIL HFA;VENTOLIN HFA) 108 (90 Base) MCG/ACT inhaler Inhale 1-2 puffs into the lungs every 6 (six) hours as needed for wheezing or shortness of breath. 05/20/18   Orland MustardWolfe, Allison, MD  benzonatate (TESSALON) 100 MG capsule Take 1 capsule (100 mg total) by mouth every 8 (eight) hours. 05/20/18   Orland MustardWolfe, Allison, MD  cyclobenzaprine (FLEXERIL) 10 MG tablet Take 1 tablet (10 mg total) by mouth 2 (two) times daily as needed for muscle spasms. 09/08/18   Dahlia ByesBast, Traci A, NP  ibuprofen (ADVIL,MOTRIN)  800 MG tablet Take 1 tablet (800 mg total) by mouth every 8 (eight) hours as needed for mild pain or moderate pain. Patient taking differently: Take 600 mg by mouth every 8 (eight) hours as needed for mild pain or moderate pain.  05/17/16   Jacalyn LefevreHaviland, Julie, MD    Family History Family History  Problem Relation Age of Onset  . Bipolar disorder Mother   . Depression Mother   . Migraines Father   . ADD / ADHD Sister   . Migraines Maternal Aunt   . Cancer Maternal Grandfather   . Heart Problems Paternal Grandfather     Social History Social History   Tobacco Use  . Smoking status: Current Every Day Smoker    Types: Cigarettes  . Smokeless tobacco: Never Used  . Tobacco comment: 4 cigarettes per day  Substance Use Topics  . Alcohol use: No  . Drug use: No     Allergies   Peanut-containing drug products   Review of Systems Review of Systems  Reason unable to perform ROS: See HPI as above.     Physical Exam Triage Vital Signs ED Triage Vitals  Enc Vitals Group     BP 09/20/18 1617 111/71     Pulse Rate 09/20/18 1617 64     Resp 09/20/18 1617 18     Temp 09/20/18 1617 97.9 F (36.6 C)     Temp Source 09/20/18 1617 Oral  SpO2 09/20/18 1617 99 %     Weight --      Height --      Head Circumference --      Peak Flow --      Pain Score 09/20/18 1618 9     Pain Loc --      Pain Edu? --      Excl. in [place]? --    No data found.  Updated Vital Signs BP 111/71 (BP Location: Right Arm)   Pulse 64   Temp 97.9 F (36.6 C) (Oral)   Resp 18   LMP 09/13/2018   SpO2 99%   Physical Exam Constitutional:      General: She is not in acute distress.    Appearance: Normal appearance. She is not ill-appearing, toxic-appearing or diaphoretic.  HENT:     Head: Normocephalic and atraumatic.     Mouth/Throat:     Mouth: Mucous membranes are moist.     Pharynx: Oropharynx is clear. Uvula midline.  Eyes:     Extraocular Movements: Extraocular movements intact.      Conjunctiva/sclera: Conjunctivae normal.     Pupils: Pupils are equal, round, and reactive to light.     Comments: Photophobic on exam.   Neck:     Musculoskeletal: Normal range of motion and neck supple.  Cardiovascular:     Rate and Rhythm: Normal rate and regular rhythm.     Heart sounds: Normal heart sounds. No murmur. No friction rub. No gallop.   Pulmonary:     Effort: Pulmonary effort is normal. No accessory muscle usage, prolonged expiration, respiratory distress or retractions.     Comments: Lungs clear to auscultation without adventitious lung sounds. Neurological:     General: No focal deficit present.     Mental Status: She is alert and oriented to person, place, and time.     GCS: GCS eye subscore is 4. GCS verbal subscore is 5. GCS motor subscore is 6.     Cranial Nerves: Cranial nerves are intact.     Sensory: Sensation is intact.     Coordination: Coordination is intact.     Gait: Gait is intact.      UC Treatments / Results  Labs (all labs ordered are listed, but only abnormal results are displayed) Labs Reviewed - No data to display  EKG None  Radiology No results found.  Procedures Procedures (including critical care time)  Medications Ordered in UC Medications  metoCLOPramide (REGLAN) injection 5 mg (5 mg Intramuscular Given 09/20/18 1700)  dexamethasone (DECADRON) injection 10 mg (10 mg Intramuscular Given 09/20/18 1700)  ketorolac (TORADOL) 30 MG/ML injection 30 mg (30 mg Intramuscular Given 09/20/18 1701)  ketorolac (TORADOL) 30 MG/ML injection (has no administration in time range)  dexamethasone (DECADRON) 10 MG/ML injection (has no administration in time range)  metoCLOPramide (REGLAN) 5 MG/ML injection (has no administration in time range)    Initial Impression / Assessment and Plan / UC Course  I have reviewed the triage vital signs and the nursing notes.  Pertinent labs & imaging results that were available during my care of the patient were  reviewed by me and considered in my medical decision making (see chart for details).    No alarming signs on exam. Neurology exam without focal deficit. History and exam consistent with migraine, will provide toradol, reglan, decadron injection in office. Return precautions given. Patient expresses understanding and agrees to plan.  Final Clinical Impressions(s) / UC Diagnoses   Final diagnoses:  Intractable migraine without aura and without status migrainosus    ED Prescriptions    None        Lurline IdolYu, Bradey Luzier V, PA-C 09/20/18 2303

## 2018-09-20 NOTE — ED Triage Notes (Signed)
Pt presents with headache today; pt is also experiencing sensitivity to light and nausea.

## 2018-11-20 ENCOUNTER — Encounter (HOSPITAL_COMMUNITY): Payer: Self-pay

## 2018-11-20 ENCOUNTER — Other Ambulatory Visit: Payer: Self-pay

## 2018-11-20 ENCOUNTER — Ambulatory Visit (HOSPITAL_COMMUNITY)
Admission: EM | Admit: 2018-11-20 | Discharge: 2018-11-20 | Disposition: A | Discharge status: Home / Self Care | Payer: Medicaid Other | Attending: Internal Medicine | Admitting: Internal Medicine

## 2018-11-20 DIAGNOSIS — S30814A Abrasion of vagina and vulva, initial encounter: Secondary | ICD-10-CM | POA: Diagnosis not present

## 2018-11-20 MED ORDER — CIPROFLOXACIN HCL 250 MG PO TABS: 250.0000 mg | ORAL_TABLET | Freq: Two times a day (BID) | ORAL | 0 refills | Status: AC

## 2018-11-20 MED ORDER — LIDOCAINE 5 % EX OINT: 1.0000 "application " | TOPICAL_OINTMENT | CUTANEOUS | 0 refills | Status: DC | PRN

## 2018-11-20 NOTE — ED Provider Notes (Signed)
[place]    CSN: [id number] Arrival date & time: 11/20/18  [place]      History   Chief Complaint Chief Complaint  Patient presents with  . Appointment  . Abscess    HPI Stacy Mcgee is a 23 y.o. female with history of asthma-controlled comes to urgent care with complaints of vaginal pain of 4 days duration.  Patient started experiencing pain after she shaved her vaginal area.  Pain was sharp and throbbing and has been progressively worse over the past few days.  Is aggravated by touching it and during sexual intercourse.  No known relieving factors.  She denies any vaginal discharge.  No fever or chills.  No dysuria urgency or frequency.  Pain does not radiate.Marland Kitchen   HPI  Past Medical History:  Diagnosis Date  . Asthma   . Eczema   . Generalized headaches   . Seasonal allergies     Patient Active Problem List   Diagnosis Date Noted  . Migraine without aura and without status migrainosus, not intractable 03/04/2015  . Tension headache 03/04/2015  . Sleeping difficulty 03/04/2015    History reviewed. No pertinent surgical history.  OB History   No obstetric history on file.      Home Medications    Prior to Admission medications   Medication Sig Start Date End Date Taking? Authorizing Provider  albuterol (PROVENTIL HFA;VENTOLIN HFA) 108 (90 Base) MCG/ACT inhaler Inhale 1-2 puffs into the lungs every 6 (six) hours as needed for wheezing or shortness of breath. 05/20/18   Orma Flaming, MD  ciprofloxacin (CIPRO) 250 MG tablet Take 1 tablet (250 mg total) by mouth every 12 (twelve) hours for 3 days. 11/20/18 11/23/18  Chase Picket, MD  ibuprofen (ADVIL,MOTRIN) 800 MG tablet Take 1 tablet (800 mg total) by mouth every 8 (eight) hours as needed for mild pain or moderate pain. Patient taking differently: Take 600 mg by mouth every 8 (eight) hours as needed for mild pain or moderate pain.  05/17/16   Isla Pence, MD  lidocaine (XYLOCAINE) 5 % ointment  Apply 1 application topically as needed. 11/20/18   LampteyMyrene Galas, MD    Family History Family History  Problem Relation Age of Onset  . Bipolar disorder Mother   . Depression Mother   . Migraines Father   . ADD / ADHD Sister   . Migraines Maternal Aunt   . Cancer Maternal Grandfather   . Heart Problems Paternal Grandfather     Social History Social History   Tobacco Use  . Smoking status: Current Every Day Smoker    Types: Cigarettes  . Smokeless tobacco: Never Used  . Tobacco comment: 4 cigarettes per day  Substance Use Topics  . Alcohol use: Yes    Comment: socailly  . Drug use: No     Allergies   Peanut-containing drug products   Review of Systems Review of Systems  Constitutional: Negative.   HENT: Negative.   Respiratory: Negative.   Cardiovascular: Negative.   Gastrointestinal: Negative.   Genitourinary: Positive for genital sores and vaginal pain. Negative for pelvic pain, vaginal bleeding and vaginal discharge.     Physical Exam Triage Vital Signs ED Triage Vitals  Enc Vitals Group     BP 11/20/18 1903 111/67     Pulse Rate 11/20/18 1903 77     Resp 11/20/18 1903 16     Temp 11/20/18 1903 98.6 F (37 C)     Temp Source 11/20/18 1903 Oral  SpO2 11/20/18 1903 100 %     Weight --      Height --      Head Circumference --      Peak Flow --      Pain Score 11/20/18 1901 6     Pain Loc --      Pain Edu? --      Excl. in [place]? --    No data found.  Updated Vital Signs BP 111/67 (BP Location: Left Arm)   Pulse 77   Temp 98.6 F (37 C) (Oral)   Resp 16   SpO2 100%   Visual Acuity Right Eye Distance:   Left Eye Distance:   Bilateral Distance:    Right Eye Near:   Left Eye Near:    Bilateral Near:     Physical Exam Vitals signs and nursing note reviewed.  Constitutional:      General: She is in acute distress.     Appearance: She is not ill-appearing or toxic-appearing.  Cardiovascular:     Pulses: Normal pulses.     Heart  sounds: Normal heart sounds. No murmur. No friction rub.  Pulmonary:     Effort: Pulmonary effort is normal. No respiratory distress.     Breath sounds: No rhonchi or rales.  Abdominal:     General: Bowel sounds are normal. There is no distension.     Palpations: Abdomen is soft.     Tenderness: There is no abdominal tenderness.  Genitourinary:    Comments: Patient has superficial ulceration consistent with shaving injury.  Ulcerations is on the posterior third of the right labia Musculoskeletal: Normal range of motion.  Neurological:     Mental Status: She is alert.      UC Treatments / Results  Labs (all labs ordered are listed, but only abnormal results are displayed) Labs Reviewed - No data to display  EKG   Radiology No results found.  Procedures Procedures (including critical care time)  Medications Ordered in UC Medications - No data to display  Initial Impression / Assessment and Plan / UC Course  I have reviewed the triage vital signs and the nursing notes.  Pertinent labs & imaging results that were available during my care of the patient were reviewed by me and considered in my medical decision making (see chart for details).     1.  Superficial laceration on the right labia following shaving: Lidocaine ointment to be applied as needed Cipro 500 mg twice daily x3 days If symptoms worsen patient can return to urgent care to be reevaluated.  If she develops any fever or chills swelling in the perineal region she needs to return to the urgent care urgently to be evaluated and managed. Final Clinical Impressions(s) / UC Diagnoses   Final diagnoses:  Abrasion of labia, initial encounter   Discharge Instructions   None    ED Prescriptions    Medication Sig Dispense Auth. Provider   ciprofloxacin (CIPRO) 250 MG tablet Take 1 tablet (250 mg total) by mouth every 12 (twelve) hours for 3 days. 6 tablet Chara Marquard, Britta MccreedyPhilip O, MD   lidocaine (XYLOCAINE) 5 %  ointment Apply 1 application topically as needed. 35.44 g Merrilee JanskyLamptey, Summer Mccolgan O, MD     Controlled Substance Prescriptions [place] Controlled Substance Registry consulted? No   Merrilee JanskyLamptey, Riki Berninger O, MD 11/20/18 [phone]

## 2018-11-20 NOTE — ED Triage Notes (Signed)
Patient presents to Urgent Care with complaints of abscess on her vagina on the right side since 4 days ago. Patient reports she noticed pain after shaving and the area has gotten progressively worse. Pt endorses pain when she wipes herself after using the bathroom and during/after sexual intercourse.

## 2019-05-09 ENCOUNTER — Emergency Department (HOSPITAL_COMMUNITY)
Admission: EM | Admit: 2019-05-09 | Discharge: 2019-05-09 | Disposition: A | Discharge status: Home / Self Care | Payer: Medicaid Other | Attending: Emergency Medicine | Admitting: Emergency Medicine

## 2019-05-09 ENCOUNTER — Other Ambulatory Visit: Payer: Self-pay

## 2019-05-09 ENCOUNTER — Emergency Department (HOSPITAL_COMMUNITY): Payer: Medicaid Other

## 2019-05-09 DIAGNOSIS — R110 Nausea: Secondary | ICD-10-CM | POA: Diagnosis not present

## 2019-05-09 DIAGNOSIS — Z20822 Contact with and (suspected) exposure to covid-19: Secondary | ICD-10-CM | POA: Diagnosis not present

## 2019-05-09 DIAGNOSIS — R05 Cough: Secondary | ICD-10-CM | POA: Insufficient documentation

## 2019-05-09 DIAGNOSIS — R519 Headache, unspecified: Secondary | ICD-10-CM | POA: Insufficient documentation

## 2019-05-09 DIAGNOSIS — J45909 Unspecified asthma, uncomplicated: Secondary | ICD-10-CM | POA: Diagnosis not present

## 2019-05-09 DIAGNOSIS — R059 Cough, unspecified: Secondary | ICD-10-CM

## 2019-05-09 DIAGNOSIS — F17210 Nicotine dependence, cigarettes, uncomplicated: Secondary | ICD-10-CM | POA: Insufficient documentation

## 2019-05-09 LAB — URINALYSIS, ROUTINE W REFLEX MICROSCOPIC
Bilirubin Urine: NEGATIVE
Glucose, UA: NEGATIVE mg/dL
Ketones, ur: NEGATIVE mg/dL
Leukocytes,Ua: NEGATIVE
Nitrite: NEGATIVE
Protein, ur: NEGATIVE mg/dL
Specific Gravity, Urine: 1.002 — ABNORMAL LOW (ref 1.005–1.030)
pH: 6 (ref 5.0–8.0)

## 2019-05-09 LAB — CBC WITH DIFFERENTIAL/PLATELET
Abs Immature Granulocytes: 0.03 10*3/uL (ref 0.00–0.07)
Basophils Absolute: 0.1 10*3/uL (ref 0.0–0.1)
Basophils Relative: 1 %
Eosinophils Absolute: 0.1 10*3/uL (ref 0.0–0.5)
Eosinophils Relative: 2 %
HCT: 42.3 % (ref 36.0–46.0)
Hemoglobin: 13.6 g/dL (ref 12.0–15.0)
Immature Granulocytes: 0 %
Lymphocytes Relative: 22 %
Lymphs Abs: 1.7 10*3/uL (ref 0.7–4.0)
MCH: 29.1 pg (ref 26.0–34.0)
MCHC: 32.2 g/dL (ref 30.0–36.0)
MCV: 90.4 fL (ref 80.0–100.0)
Monocytes Absolute: 0.6 10*3/uL (ref 0.1–1.0)
Monocytes Relative: 7 %
Neutro Abs: 5.4 10*3/uL (ref 1.7–7.7)
Neutrophils Relative %: 68 %
Platelets: 344 10*3/uL (ref 150–400)
RBC: 4.68 MIL/uL (ref 3.87–5.11)
RDW: 13.2 % (ref 11.5–15.5)
WBC: 7.9 10*3/uL (ref 4.0–10.5)
nRBC: 0 % (ref 0.0–0.2)

## 2019-05-09 LAB — COMPREHENSIVE METABOLIC PANEL
ALT: 22 U/L (ref 0–44)
AST: 19 U/L (ref 15–41)
Albumin: 3.8 g/dL (ref 3.5–5.0)
Alkaline Phosphatase: 75 U/L (ref 38–126)
Anion gap: 11 (ref 5–15)
BUN: 8 mg/dL (ref 6–20)
CO2: 24 mmol/L (ref 22–32)
Calcium: 9.1 mg/dL (ref 8.9–10.3)
Chloride: 104 mmol/L (ref 98–111)
Creatinine, Ser: 0.69 mg/dL (ref 0.44–1.00)
GFR calc Af Amer: >60 mL/min (ref >60)
GFR calc non Af Amer: >60 mL/min (ref >60)
Glucose, Bld: 105 mg/dL — ABNORMAL HIGH (ref 70–99)
Potassium: 3.4 mmol/L — ABNORMAL LOW (ref 3.5–5.1)
Sodium: 139 mmol/L (ref 135–145)
Total Bilirubin: 0.6 mg/dL (ref 0.3–1.2)
Total Protein: 7.4 g/dL (ref 6.5–8.1)

## 2019-05-09 LAB — LIPASE, BLOOD: Lipase: 22 U/L (ref 11–51)

## 2019-05-09 LAB — PREGNANCY, URINE: Preg Test, Ur: NEGATIVE

## 2019-05-09 LAB — SARS CORONAVIRUS 2 (TAT 6-24 HRS): SARS Coronavirus 2: NEGATIVE

## 2019-05-09 MED ORDER — DIPHENHYDRAMINE HCL 50 MG/ML IJ SOLN
25.0000 mg | Freq: Once | INTRAMUSCULAR | Status: AC
  Administered 2019-05-09: 12:00:00 25 mg via INTRAVENOUS
  Filled 2019-05-09: qty 1

## 2019-05-09 MED ORDER — MORPHINE SULFATE (PF) 4 MG/ML IV SOLN: 4.0000 mg | Freq: Once | INTRAVENOUS | Status: DC

## 2019-05-09 MED ORDER — ONDANSETRON 4 MG PO TBDP: 4.0000 mg | ORAL_TABLET | Freq: Three times a day (TID) | ORAL | 0 refills | Status: DC | PRN

## 2019-05-09 MED ORDER — METOCLOPRAMIDE HCL 5 MG/ML IJ SOLN
10.0000 mg | Freq: Once | INTRAMUSCULAR | Status: AC
  Administered 2019-05-09: 12:00:00 10 mg via INTRAVENOUS
  Filled 2019-05-09: qty 2

## 2019-05-09 MED ORDER — ONDANSETRON HCL 4 MG/2ML IJ SOLN
4.0000 mg | Freq: Once | INTRAMUSCULAR | Status: AC
  Administered 2019-05-09: 12:00:00 4 mg via INTRAVENOUS
  Filled 2019-05-09: qty 2

## 2019-05-09 MED ORDER — SODIUM CHLORIDE 0.9 % IV SOLN: Freq: Once | INTRAVENOUS | Status: AC

## 2019-05-09 MED ORDER — SUMATRIPTAN SUCCINATE 50 MG PO TABS: 50.0000 mg | ORAL_TABLET | ORAL | 0 refills | Status: DC | PRN

## 2019-05-09 NOTE — ED Triage Notes (Signed)
Pt. Stated, I started having SOB with my Asthma, I have an Inhaler not working.

## 2019-05-09 NOTE — ED Provider Notes (Addendum)
[name] EMERGENCY DEPARTMENT Provider Note   CSN: [id number] Arrival date & time: 05/09/19  1046     History Chief Complaint  Patient presents with  . Nausea  . Headache  . Cough    Stacy Mcgee is a 24 y.o. female.  HPI 24 year old female with a history of migraines diagnosed by neurologist in the past presents today for left-sided headache that began this morning with associated nausea and chills.  She states that she felt short of breath last night but does not endorse that currently.  She states she has a history of asthma and has used her inhaler with no improvement yesterday however states that she has no shortness of breath or chest pain currently.  She denies any cough but does say that she feels fatigued along with her previously mentioned headache.  Patient denies any vision changes--but states that this has been a symptom of her migraines in the past--denies any weakness in any limb, numbness, disorientation, confusion, lethargy.  Patient denies any bowel or bladder incontinence, history of IV drug use, history of serious infections.  She denies any neck stiffness.  She also states she does not have any photophobia.      Past Medical History:  Diagnosis Date  . Asthma   . Eczema   . Generalized headaches   . Seasonal allergies     Patient Active Problem List   Diagnosis Date Noted  . Migraine without aura and without status migrainosus, not intractable 03/04/2015  . Tension headache 03/04/2015  . Sleeping difficulty 03/04/2015    No past surgical history on file.   OB History   No obstetric history on file.     Family History  Problem Relation Age of Onset  . Bipolar disorder Mother   . Depression Mother   . Migraines Father   . ADD / ADHD Sister   . Migraines Maternal Aunt   . Cancer Maternal Grandfather   . Heart Problems Paternal Grandfather     Social History   Tobacco Use  . Smoking status: Current Every Day  Smoker    Types: Cigarettes  . Smokeless tobacco: Never Used  . Tobacco comment: 4 cigarettes per day  Substance Use Topics  . Alcohol use: Yes    Comment: socailly  . Drug use: No    Home Medications Prior to Admission medications   Medication Sig Start Date End Date Taking? Authorizing Provider  albuterol (PROVENTIL HFA;VENTOLIN HFA) 108 (90 Base) MCG/ACT inhaler Inhale 1-2 puffs into the lungs every 6 (six) hours as needed for wheezing or shortness of breath. 05/20/18   Orland Mustard, MD  ibuprofen (ADVIL,MOTRIN) 800 MG tablet Take 1 tablet (800 mg total) by mouth every 8 (eight) hours as needed for mild pain or moderate pain. Patient taking differently: Take 600 mg by mouth every 8 (eight) hours as needed for mild pain or moderate pain.  05/17/16   Jacalyn Lefevre, MD  lidocaine (XYLOCAINE) 5 % ointment Apply 1 application topically as needed. 11/20/18   Merrilee Jansky, MD  ondansetron (ZOFRAN ODT) 4 MG disintegrating tablet Take 1 tablet (4 mg total) by mouth every 8 (eight) hours as needed for nausea or vomiting. 05/09/19   Gailen Shelter, PA  SUMAtriptan (IMITREX) 50 MG tablet Take 1 tablet (50 mg total) by mouth every 2 (two) hours as needed for migraine. May repeat in 2 hours if headache persists or recurs. 05/09/19   Gailen Shelter, PA  Allergies    Peanut-containing drug products  Review of Systems   Review of Systems  Constitutional: Positive for chills and fatigue. Negative for fever.  HENT: Negative for congestion.   Eyes: Negative for pain.  Respiratory: Negative for cough and shortness of breath.   Cardiovascular: Negative for chest pain and leg swelling.  Gastrointestinal: Positive for nausea. Negative for abdominal pain and vomiting.  Genitourinary: Negative for dysuria.  Musculoskeletal: Positive for myalgias.  Skin: Negative for rash.  Neurological: Positive for headaches. Negative for dizziness.    Physical Exam Updated Vital Signs BP (!) 112/49 (BP  Location: Left Arm)   Pulse 60   Temp 98.3 F (36.8 C)   Resp 20   LMP 04/17/2019   SpO2 100%   Physical Exam Vitals and nursing note reviewed.  Constitutional:      General: She is not in acute distress. HENT:     Head: Normocephalic and atraumatic.     Nose: Nose normal.     Mouth/Throat:     Mouth: Mucous membranes are moist.  Eyes:     General: No scleral icterus. Cardiovascular:     Rate and Rhythm: Normal rate and regular rhythm.     Pulses: Normal pulses.     Heart sounds: Normal heart sounds.  Pulmonary:     Effort: Pulmonary effort is normal. No respiratory distress.     Breath sounds: No wheezing.  Abdominal:     Palpations: Abdomen is soft.     Tenderness: There is no abdominal tenderness. There is no right CVA tenderness, left CVA tenderness, guarding or rebound.  Musculoskeletal:     Cervical back: Normal range of motion.     Right lower leg: No edema.     Left lower leg: No edema.  Skin:    General: Skin is warm and dry.     Capillary Refill: Capillary refill takes less than 2 seconds.  Neurological:     Mental Status: She is alert. Mental status is at baseline.     Comments: Alert and oriented to self, place, time and event.   Speech is fluent, clear without dysarthria or dysphasia.   Strength 5/5 in upper/lower extremities  Sensation intact in upper/lower extremities   Normal gait.  Negative Romberg. No pronator drift.  Normal finger-to-nose and feet tapping.  CN I not tested  CN II grossly intact visual fields bilaterally. Did not visualize posterior eye.   CN III, IV, VI PERRLA and EOMs intact bilaterally  CN V Intact sensation to sharp and light touch to the face  CN VII facial movements symmetric  CN VIII not tested  CN IX, X no uvula deviation, symmetric rise of soft palate  CN XI 5/5 SCM and trapezius strength bilaterally  CN XII Midline tongue protrusion, symmetric L/R movements   Psychiatric:        Mood and Affect: Mood normal.         Behavior: Behavior normal.     ED Results / Procedures / Treatments   Labs (all labs ordered are listed, but only abnormal results are displayed) Labs Reviewed  URINALYSIS, ROUTINE W REFLEX MICROSCOPIC - Abnormal; Notable for the following components:      Result Value   Color, Urine STRAW (*)    Specific Gravity, Urine 1.002 (*)    Hgb urine dipstick LARGE (*)    Bacteria, UA RARE (*)    All other components within normal limits  COMPREHENSIVE METABOLIC PANEL - Abnormal; Notable for the  following components:   Potassium 3.4 (*)    Glucose, Bld 105 (*)    All other components within normal limits  SARS CORONAVIRUS 2 (TAT 6-24 HRS)  PREGNANCY, URINE  LIPASE, BLOOD  CBC WITH DIFFERENTIAL/PLATELET    EKG None  Radiology DG Chest Port 1 View  Result Date: 05/09/2019 CLINICAL DATA:  SOB, Nausea, Headache, Cough. EXAM: PORTABLE CHEST 1 VIEW COMPARISON:  Chest radiograph 05/20/2018 FINDINGS: The heart size and mediastinal contours are within normal limits. The lungs are clear. No pneumothorax or significant pleural effusion. The visualized skeletal structures are unremarkable. IMPRESSION: No evidence of active disease. Electronically Signed   By: Audie Pinto M.D.   On: 05/09/2019 12:34    Procedures Procedures (including critical care time)  Medications Ordered in ED Medications  ondansetron (ZOFRAN) injection 4 mg (4 mg Intravenous Given 05/09/19 1209)  0.9 %  sodium chloride infusion ( Intravenous Stopped 05/09/19 1306)  metoCLOPramide (REGLAN) injection 10 mg (10 mg Intravenous Given 05/09/19 1211)  diphenhydrAMINE (BENADRYL) injection 25 mg (25 mg Intravenous Given 05/09/19 1213)    ED Course  I have reviewed the triage vital signs and the nursing notes.  Pertinent labs & imaging results that were available during my care of the patient were reviewed by me and considered in my medical decision making (see chart for details).  Patient does endorse symptoms consistent  with Covid.  Will swab patient with outpatient test.  Her vitals are within normal limits although she did have a recorded heart rate that was tachycardic at 1 point I suspect this was an erroneous measurement.  Heart rate has been 60-80 on my initial evaluation and reevaluation.  Patient's main complaint is her headache which she states is similar to migraine she has had in the past.  She has been evaluated by neurology and been diagnosed with migraines however she does not have any migraine medications for home use.  Patient has unremarkable exam with clear lungs, is well-appearing, no neuro deficits.  With deep palpation of the right lower quadrant she does endorse some tenderness however her lab work is unremarkable.  She has no leukocytosis or electrolyte abnormalities--other than very mild hypokalemia.  Her UA significant for some blood however she states that she recently finished her period.  ACS is negative.  She has no urinary symptoms although she has rare bacteria in her urine I doubt that she has a urinary tract infection.  She will follow up with her primary care doctor.  She will return to ED if she has any new or concerning symptoms.  She will be evaluated in 24 hours for her abdominal pain in case this is early appendicitis.  Shared decision-making conversation with patient about getting CT scan today she states that she would prefer to hold off on CT scan I think this is reasonable.  Reassessed patient after 1 L normal saline, Benadryl, Reglan, Zofran.  She feels completely better and would like to be discharged at this time.  Covid test pending.  Give recommendations for self-isolation.  Patient discharged with Zofran and sumatriptan.  I did counsel patient on use of sumatriptan for abortive migraine medication.    Stacy Mcgee was evaluated in Emergency Department on 05/09/2019 for the symptoms described in the history of present illness. She was evaluated in the context of  the global COVID-19 pandemic, which necessitated consideration that the patient might be at risk for infection with the SARS-CoV-2 virus that causes COVID-19. Institutional protocols and algorithms that  pertain to the evaluation of patients at risk for COVID-19 are in a state of rapid change based on information released by regulatory bodies including the CDC and federal and state organizations. These policies and algorithms were followed during the patient's care in the ED. The medical records were personally reviewed by myself. I personally reviewed all lab results and interpreted all imaging studies and either concurred with their official read or contacted radiology for clarification.   This patient appears reasonably screened and I doubt any other medical condition requiring further workup, evaluation, or treatment in the ED at this time prior to discharge.   Patient's vitals are WNL apart from vital sign abnormalities discussed above, patient is in NAD, and able to ambulate in the ED at their baseline and able to tolerate PO.  Pain has been managed or a plan has been made for home management and has no complaints prior to discharge. Patient is comfortable with above plan and for discharge at this time. All questions were answered prior to disposition. Results from the ER workup discussed with the patient face to face and all questions answered to the best of my ability. The patient is safe for discharge with strict return precautions. Patient appears safe for discharge with appropriate follow-up. Conveyed my impression with the patient and they voiced understanding and are agreeable to plan.   An After Visit Summary was printed and given to the patient.  Portions of this note were generated with Scientist, clinical (histocompatibility and immunogenetics). Dictation errors may occur despite best attempts at proofreading.      MDM Rules/Calculators/A&P                       Final Clinical Impression(s) / ED Diagnoses Final  diagnoses:  Bad headache  Nausea  Cough    Rx / DC Orders ED Discharge Orders         Ordered    SUMAtriptan (IMITREX) 50 MG tablet  Every 2 hours PRN     05/09/19 1310    ondansetron (ZOFRAN ODT) 4 MG disintegrating tablet  Every 8 hours PRN     05/09/19 1310           Gailen Shelter, [place] 05/09/19 1419    Cathren Laine, MD 05/09/19 1514    Solon Augusta Sanford, [place] 06/02/19 1910    Cathren Laine, MD 06/04/19 [phone]

## 2019-05-09 NOTE — ED Notes (Signed)
Pt states that she feels better.

## 2019-05-09 NOTE — Discharge Instructions (Addendum)
You have been prescribed zofran and sumatriptan. Zofran is for nausea. Sumatriptan is for the migraines.  Please follow-up with your primary care doctor.  If you do not have 1 please follow-up with the [facility].  Your COVID test is pending; you should expect results in 2-3 days. You can access your results on your MyChart--if you test positive you should receive a phone call.  In the meantime follow CDC guidelines and quarantine, wear a mask, wash hands often.   Please take over the counter vitamin D 2000-4000 units per day. I also recommend zinc 50 mg per day for the next two weeks.   Please return to ED if you feel have difficulty breathing or have emergent, new or concerning symptoms.  Patients who have symptoms consistent with COVID-19 should self isolated for: At least 3 days (72 hours) have passed since recovery, defined as resolution of fever without the use of fever reducing medications and improvement in respiratory symptoms (e.g., cough, shortness of breath), and At least 7 days have passed since symptoms first appeared.       Person Under Monitoring Name: Stacy Mcgee  Location: [address] [place] [employer] [place] [address]   Infection Prevention Recommendations for Individuals Confirmed to have, or Being Evaluated for, 2019 Novel Coronavirus (COVID-19) Infection Who Receive Care at Home  Individuals who are confirmed to have, or are being evaluated for, COVID-19 should follow the prevention steps below until a healthcare provider or local or state health department says they can return to normal activities.  Stay home except to get medical care You should restrict activities outside your home, except for getting medical care. Do not go to work, school, or public areas, and do not use public transportation or taxis.  Call ahead before visiting your doctor Before your medical appointment, call the healthcare provider and tell them that you  have, or are being evaluated for, COVID-19 infection. This will help the healthcare provider's office take steps to keep other people from getting infected. Ask your healthcare provider to call the local or state health department.  Monitor your symptoms Seek prompt medical attention if your illness is worsening (e.g., difficulty breathing). Before going to your medical appointment, call the healthcare provider and tell them that you have, or are being evaluated for, COVID-19 infection. Ask your healthcare provider to call the local or state health department.  Wear a facemask You should wear a facemask that covers your nose and mouth when you are in the same room with other people and when you visit a healthcare provider. People who live with or visit you should also wear a facemask while they are in the same room with you.  Separate yourself from other people in your home As much as possible, you should stay in a different room from other people in your home. Also, you should use a separate bathroom, if available.  Avoid sharing household items You should not share dishes, drinking glasses, cups, eating utensils, towels, bedding, or other items with other people in your home. After using these items, you should wash them thoroughly with soap and water.  Cover your coughs and sneezes Cover your mouth and nose with a tissue when you cough or sneeze, or you can cough or sneeze into your sleeve. Throw used tissues in a lined trash can, and immediately wash your hands with soap and water for at least 20 seconds or use an alcohol-based hand rub.  Wash your Interior and spatial designer your  hands often and thoroughly with soap and water for at least 20 seconds. You can use an alcohol-based hand sanitizer if soap and water are not available and if your hands are not visibly dirty. Avoid touching your eyes, nose, and mouth with unwashed hands.   Prevention Steps for Caregivers and Household Members  of Individuals Confirmed to have, or Being Evaluated for, COVID-19 Infection Being Cared for in the Home  If you live with, or provide care at home for, a person confirmed to have, or being evaluated for, COVID-19 infection please follow these guidelines to prevent infection:  Follow healthcare provider's instructions Make sure that you understand and can help the patient follow any healthcare provider instructions for all care.  Provide for the patient's basic needs You should help the patient with basic needs in the home and provide support for getting groceries, prescriptions, and other personal needs.  Monitor the patient's symptoms If they are getting sicker, call his or her medical provider and tell them that the patient has, or is being evaluated for, COVID-19 infection. This will help the healthcare provider's office take steps to keep other people from getting infected. Ask the healthcare provider to call the local or state health department.  Limit the number of people who have contact with the patient If possible, have only one caregiver for the patient. Other household members should stay in another home or place of residence. If this is not possible, they should stay in another room, or be separated from the patient as much as possible. Use a separate bathroom, if available. Restrict visitors who do not have an essential need to be in the home.  Keep older adults, very young children, and other sick people away from the patient Keep older adults, very young children, and those who have compromised immune systems or chronic health conditions away from the patient. This includes people with chronic heart, lung, or kidney conditions, diabetes, and cancer.  Ensure good ventilation Make sure that shared spaces in the home have good air flow, such as from an air conditioner or an opened window, weather permitting.  Wash your hands often Wash your hands often and thoroughly with  soap and water for at least 20 seconds. You can use an alcohol based hand sanitizer if soap and water are not available and if your hands are not visibly dirty. Avoid touching your eyes, nose, and mouth with unwashed hands. Use disposable paper towels to dry your hands. If not available, use dedicated cloth towels and replace them when they become wet.  Wear a facemask and gloves Wear a disposable facemask at all times in the room and gloves when you touch or have contact with the patient's blood, body fluids, and/or secretions or excretions, such as sweat, saliva, sputum, nasal mucus, vomit, urine, or feces.  Ensure the mask fits over your nose and mouth tightly, and do not touch it during use. Throw out disposable facemasks and gloves after using them. Do not reuse. Wash your hands immediately after removing your facemask and gloves. If your personal clothing becomes contaminated, carefully remove clothing and launder. Wash your hands after handling contaminated clothing. Place all used disposable facemasks, gloves, and other waste in a lined container before disposing them with other household waste. Remove gloves and wash your hands immediately after handling these items.  Do not share dishes, glasses, or other household items with the patient Avoid sharing household items. You should not share dishes, drinking glasses, cups, eating utensils, towels,  bedding, or other items with a patient who is confirmed to have, or being evaluated for, COVID-19 infection. After the person uses these items, you should wash them thoroughly with soap and water.  Wash laundry thoroughly Immediately remove and wash clothes or bedding that have blood, body fluids, and/or secretions or excretions, such as sweat, saliva, sputum, nasal mucus, vomit, urine, or feces, on them. Wear gloves when handling laundry from the patient. Read and follow directions on labels of laundry or clothing items and detergent. In general,  wash and dry with the warmest temperatures recommended on the label.  Clean all areas the individual has used often Clean all touchable surfaces, such as counters, tabletops, doorknobs, bathroom fixtures, toilets, phones, keyboards, tablets, and bedside tables, every day. Also, clean any surfaces that may have blood, body fluids, and/or secretions or excretions on them. Wear gloves when cleaning surfaces the patient has come in contact with. Use a diluted bleach solution (e.g., dilute bleach with 1 part bleach and 10 parts water) or a household disinfectant with a label that says EPA-registered for coronaviruses. To make a bleach solution at home, add 1 tablespoon of bleach to 1 quart (4 cups) of water. For a larger supply, add  cup of bleach to 1 gallon (16 cups) of water. Read labels of cleaning products and follow recommendations provided on product labels. Labels contain instructions for safe and effective use of the cleaning product including precautions you should take when applying the product, such as wearing gloves or eye protection and making sure you have good ventilation during use of the product. Remove gloves and wash hands immediately after cleaning.  Monitor yourself for signs and symptoms of illness Caregivers and household members are considered close contacts, should monitor their health, and will be asked to limit movement outside of the home to the extent possible. Follow the monitoring steps for close contacts listed on the symptom monitoring form.   ? If you have additional questions, contact your local health department or call the epidemiologist on call at [phone] (available 24/7). ? This guidance is subject to change. For the most up-to-date guidance from [facility], please refer to their website: YouBlogs.pl

## 2019-05-25 ENCOUNTER — Ambulatory Visit (HOSPITAL_COMMUNITY)
Admission: EM | Admit: 2019-05-25 | Discharge: 2019-05-25 | Disposition: A | Discharge status: Home / Self Care | Payer: Medicaid Other | Attending: Family Medicine | Admitting: Family Medicine

## 2019-05-25 ENCOUNTER — Encounter (HOSPITAL_COMMUNITY): Payer: Self-pay

## 2019-05-25 ENCOUNTER — Other Ambulatory Visit: Payer: Self-pay

## 2019-05-25 DIAGNOSIS — Z711 Person with feared health complaint in whom no diagnosis is made: Secondary | ICD-10-CM | POA: Diagnosis present

## 2019-05-25 MED ORDER — CEFTRIAXONE SODIUM 500 MG IJ SOLR
500.0000 mg | Freq: Once | INTRAMUSCULAR | Status: AC
  Administered 2019-05-25: 500 mg via INTRAMUSCULAR

## 2019-05-25 MED ORDER — DOXYCYCLINE HYCLATE 100 MG PO CAPS: 100.0000 mg | ORAL_CAPSULE | Freq: Two times a day (BID) | ORAL | 0 refills | Status: DC

## 2019-05-25 MED ORDER — CEFTRIAXONE SODIUM 500 MG IJ SOLR
INTRAMUSCULAR | Status: AC
  Filled 2019-05-25: qty 500

## 2019-05-25 NOTE — ED Provider Notes (Signed)
[facility]   [id number] 05/25/19 Arrival Time: 0902  ASSESSMENT & PLAN:  1. Concern about STD in female without diagnosis       Discharge Instructions     You have been given the following today for treatment of suspected gonorrhea and/or chlamydia:  cefTRIAXone (ROCEPHIN) injection 500 mg  Please pick up your prescription for doxycycline 100 mg and begin taking twice daily for the next seven (7) days.  Even though we have treated you today, we have sent testing for sexually transmitted infections. We will notify you of any positive results once they are received. If required, we will prescribe any medications you might need.  Please refrain from all sexual activity for at least the next seven days.     Without s/s of PID.  Labs Reviewed  CERVICOVAGINAL ANCILLARY ONLY     Will notify of any positive results. Instructed to refrain from sexual activity for at least seven days.  Reviewed expectations re: course of current medical issues. Questions answered. Outlined signs and symptoms indicating need for more acute intervention. Patient verbalized understanding. After Visit Summary given.   SUBJECTIVE:  Stacy Mcgee is a 24 y.o. female who reports noting + chlamydia on her mychart acct; 2018; never treated. Reports no current vaginal discharge. Denies: urinary frequency, dysuria, gross hematuria and urinary incontinence. Afebrile. No abdominal or pelvic pain. Normal PO intake wihout n/v. No genital rashes or lesions. Reports that she is sexually active with single female partner for the past four years.  Patient's last menstrual period was 05/21/2019.   OBJECTIVE:  Vitals:   05/25/19 0920  BP: 118/79  Pulse: 92  Resp: 18  Temp: 98.4 F (36.9 C)  TempSrc: Oral  SpO2: 96%     General appearance: alert, cooperative, appears stated age and no distress Lungs: unlabored respirations; speaks full sentences without difficulty Back: no CVA  tenderness; FROM at waist Abdomen: soft, non-tender GU: deferred Skin: warm and dry Psychological: alert and cooperative; normal mood and affect.  Recent labs reviewed by me: Results for orders placed or performed during the hospital encounter of 05/09/19  SARS CORONAVIRUS 2 (TAT 6-24 HRS) Nasopharyngeal Nasopharyngeal Swab   Specimen: Nasopharyngeal Swab  Result Value Ref Range   SARS Coronavirus 2 NEGATIVE NEGATIVE  Pregnancy, urine  Result Value Ref Range   Preg Test, Ur NEGATIVE NEGATIVE  Urinalysis, Routine w reflex microscopic  Result Value Ref Range   Color, Urine STRAW (A) YELLOW   APPearance CLEAR CLEAR   Specific Gravity, Urine 1.002 (L) 1.005 - 1.030   pH 6.0 5.0 - 8.0   Glucose, UA NEGATIVE NEGATIVE mg/dL   Hgb urine dipstick LARGE (A) NEGATIVE   Bilirubin Urine NEGATIVE NEGATIVE   Ketones, ur NEGATIVE NEGATIVE mg/dL   Protein, ur NEGATIVE NEGATIVE mg/dL   Nitrite NEGATIVE NEGATIVE   Leukocytes,Ua NEGATIVE NEGATIVE   RBC / HPF 0-5 0 - 5 RBC/hpf   WBC, UA 0-5 0 - 5 WBC/hpf   Bacteria, UA RARE (A) NONE SEEN   Squamous Epithelial / LPF 0-5 0 - 5  Lipase, blood  Result Value Ref Range   Lipase 22 11 - 51 U/L  Comprehensive metabolic panel  Result Value Ref Range   Sodium 139 135 - 145 mmol/L   Potassium 3.4 (L) 3.5 - 5.1 mmol/L   Chloride 104 98 - 111 mmol/L   CO2 24 22 - 32 mmol/L   Glucose, Bld 105 (H) 70 - 99 mg/dL   BUN 8 6 -  20 mg/dL   Creatinine, Ser 0.69 0.44 - 1.00 mg/dL   Calcium 9.1 8.9 - 10.3 mg/dL   Total Protein 7.4 6.5 - 8.1 g/dL   Albumin 3.8 3.5 - 5.0 g/dL   AST 19 15 - 41 U/L   ALT 22 0 - 44 U/L   Alkaline Phosphatase 75 38 - 126 U/L   Total Bilirubin 0.6 0.3 - 1.2 mg/dL   GFR calc non Af Amer >60 >60 mL/min   GFR calc Af Amer >60 >60 mL/min   Anion gap 11 5 - 15  CBC with Differential/Platelet  Result Value Ref Range   WBC 7.9 4.0 - 10.5 K/uL   RBC 4.68 3.87 - 5.11 MIL/uL   Hemoglobin 13.6 12.0 - 15.0 g/dL   HCT 42.3 36.0 - 46.0 %     MCV 90.4 80.0 - 100.0 fL   MCH 29.1 26.0 - 34.0 pg   MCHC 32.2 30.0 - 36.0 g/dL   RDW 13.2 11.5 - 15.5 %   Platelets 344 150 - 400 K/uL   nRBC 0.0 0.0 - 0.2 %   Neutrophils Relative % 68 %   Neutro Abs 5.4 1.7 - 7.7 K/uL   Lymphocytes Relative 22 %   Lymphs Abs 1.7 0.7 - 4.0 K/uL   Monocytes Relative 7 %   Monocytes Absolute 0.6 0.1 - 1.0 K/uL   Eosinophils Relative 2 %   Eosinophils Absolute 0.1 0.0 - 0.5 K/uL   Basophils Relative 1 %   Basophils Absolute 0.1 0.0 - 0.1 K/uL   Immature Granulocytes 0 %   Abs Immature Granulocytes 0.03 0.00 - 0.07 K/uL    Labs Reviewed  CERVICOVAGINAL ANCILLARY ONLY    Allergies  Allergen Reactions  . Peanut-Containing Drug Products Anaphylaxis    Past Medical History:  Diagnosis Date  . Asthma   . Eczema   . Generalized headaches   . Seasonal allergies    Family History  Problem Relation Age of Onset  . Bipolar disorder Mother   . Depression Mother   . Migraines Father   . ADD / ADHD Sister   . Migraines Maternal Aunt   . Cancer Maternal Grandfather   . Heart Problems Paternal Grandfather    Social History   Socioeconomic History  . Marital status: Single    Spouse name: Not on file  . Number of children: Not on file  . Years of education: Not on file  . Highest education level: Not on file  Occupational History  . Not on file  Tobacco Use  . Smoking status: Current Every Day Smoker    Types: Cigarettes  . Smokeless tobacco: Never Used  . Tobacco comment: 4 cigarettes per day  Substance and Sexual Activity  . Alcohol use: Yes    Comment: socailly  . Drug use: No  . Sexual activity: Yes    Birth control/protection: Condom  Other Topics Concern  . Not on file  Social History Narrative   [name] is in twelfth grade at [employer]. She is doing well.    Lives with her mother, younger brother, and younger sister. She has a 18 year old sister that does not live in the home.   Social Determinants of  Health   Financial Resource Strain:   . Difficulty of Paying Living Expenses: Not on file  Food Insecurity:   . Worried About Charity fundraiser in the Last Year: Not on file  . Ran Out of Food in the Last  Year: Not on file  Transportation Needs:   . Lack of Transportation (Medical): Not on file  . Lack of Transportation (Non-Medical): Not on file  Physical Activity:   . Days of Exercise per Week: Not on file  . Minutes of Exercise per Session: Not on file  Stress:   . Feeling of Stress : Not on file  Social Connections:   . Frequency of Communication with Friends and Family: Not on file  . Frequency of Social Gatherings with Friends and Family: Not on file  . Attends Religious Services: Not on file  . Active Member of Clubs or Organizations: Not on file  . Attends Banker Meetings: Not on file  . Marital Status: Not on file  Intimate Partner Violence:   . Fear of Current or Ex-Partner: Not on file  . Emotionally Abused: Not on file  . Physically Abused: Not on file  . Sexually Abused: Not on file          Mardella Layman, MD 05/25/19 [phone]

## 2019-05-25 NOTE — Discharge Instructions (Addendum)

## 2019-05-25 NOTE — ED Triage Notes (Signed)
Pt presents for STD testing & treatment; pt states she recently set up her mychart account to check her covid results and saw a past STD screening for STDs from 2018 where she tested positive for chylamydia and was never treated.

## 2019-05-27 LAB — CERVICOVAGINAL ANCILLARY ONLY
Bacterial vaginitis: POSITIVE — AB
Candida vaginitis: NEGATIVE
Chlamydia: NEGATIVE
Neisseria Gonorrhea: NEGATIVE
Trichomonas: POSITIVE — AB

## 2019-05-29 ENCOUNTER — Telehealth (HOSPITAL_COMMUNITY): Payer: Self-pay | Admitting: Emergency Medicine

## 2019-05-29 MED ORDER — METRONIDAZOLE 500 MG PO TABS: 500.0000 mg | ORAL_TABLET | Freq: Two times a day (BID) | ORAL | 0 refills | Status: AC

## 2019-05-29 NOTE — Telephone Encounter (Signed)
Bacterial vaginosis is positive. Pt needs treatment. Flagyl 500 mg BID x 7 days #14 no refills sent to patients pharmacy of choice.    Trichomonas is positive. Rx  for Flagyl was sent to the pharmacy of record. Pt needs education to refrain from sexual intercourse for 7 days to give the medicine time to work. Sexual partners need to be notified and tested/treated. Condoms may reduce risk of reinfection. Recheck for further evaluation if symptoms are not improving.   Patient contacted by phone and made aware of    results. Pt verbalized understanding and had all questions answered.      

## 2019-06-10 ENCOUNTER — Emergency Department (HOSPITAL_COMMUNITY)
Admission: EM | Admit: 2019-06-10 | Discharge: 2019-06-10 | Disposition: A | Discharge status: Home / Self Care | Payer: Medicaid Other | Attending: Emergency Medicine | Admitting: Emergency Medicine

## 2019-06-10 ENCOUNTER — Emergency Department (HOSPITAL_COMMUNITY): Payer: Medicaid Other

## 2019-06-10 ENCOUNTER — Encounter (HOSPITAL_COMMUNITY): Payer: Self-pay

## 2019-06-10 ENCOUNTER — Other Ambulatory Visit: Payer: Self-pay

## 2019-06-10 DIAGNOSIS — R079 Chest pain, unspecified: Secondary | ICD-10-CM | POA: Diagnosis present

## 2019-06-10 DIAGNOSIS — Z5321 Procedure and treatment not carried out due to patient leaving prior to being seen by health care provider: Secondary | ICD-10-CM | POA: Insufficient documentation

## 2019-06-10 LAB — CBC
HCT: 43 % (ref 36.0–46.0)
Hemoglobin: 13.9 g/dL (ref 12.0–15.0)
MCH: 28 pg (ref 26.0–34.0)
MCHC: 32.3 g/dL (ref 30.0–36.0)
MCV: 86.5 fL (ref 80.0–100.0)
Platelets: 377 10*3/uL (ref 150–400)
RBC: 4.97 MIL/uL (ref 3.87–5.11)
RDW: 13 % (ref 11.5–15.5)
WBC: 10.1 10*3/uL (ref 4.0–10.5)
nRBC: 0 % (ref 0.0–0.2)

## 2019-06-10 LAB — BASIC METABOLIC PANEL
Anion gap: 15 (ref 5–15)
BUN: 6 mg/dL (ref 6–20)
CO2: 21 mmol/L — ABNORMAL LOW (ref 22–32)
Calcium: 9.3 mg/dL (ref 8.9–10.3)
Chloride: 105 mmol/L (ref 98–111)
Creatinine, Ser: 0.76 mg/dL (ref 0.44–1.00)
GFR calc Af Amer: >60 mL/min (ref >60)
GFR calc non Af Amer: >60 mL/min (ref >60)
Glucose, Bld: 100 mg/dL — ABNORMAL HIGH (ref 70–99)
Potassium: 3.2 mmol/L — ABNORMAL LOW (ref 3.5–5.1)
Sodium: 141 mmol/L (ref 135–145)

## 2019-06-10 LAB — TROPONIN I (HIGH SENSITIVITY): Troponin I (High Sensitivity): 3 ng/L (ref <18)

## 2019-06-10 LAB — I-STAT BETA HCG BLOOD, ED (MC, WL, AP ONLY): I-stat hCG, quantitative: <5 m[IU]/mL (ref <5)

## 2019-06-10 MED ORDER — SODIUM CHLORIDE 0.9% FLUSH: 3.0000 mL | Freq: Once | INTRAVENOUS | Status: DC

## 2019-06-10 NOTE — ED Notes (Signed)
Pt left campus. Was on phone and told them that they were leaving the ED. RN Woodroe Chen notified.

## 2019-06-10 NOTE — ED Triage Notes (Signed)
Pt arrives to ED w/ c/o chest pain and SOB since yesterday. Pt has hx of asthma and states her rescue inhaler.

## 2019-06-10 NOTE — ED Notes (Signed)
Pt up and ambulating to the bathroom at this time.

## 2019-06-10 NOTE — ED Notes (Signed)
Pt in ED lobby waiting for room.  Pt st's she can not breath.  Vitals obtained.  Vitals normal 02 sat's 100 on R/A.  Pt talking in full sentences

## 2019-06-12 ENCOUNTER — Other Ambulatory Visit: Payer: Self-pay

## 2019-06-12 ENCOUNTER — Encounter (HOSPITAL_COMMUNITY): Payer: Self-pay | Admitting: Emergency Medicine

## 2019-06-12 ENCOUNTER — Ambulatory Visit (HOSPITAL_COMMUNITY)
Admission: EM | Admit: 2019-06-12 | Discharge: 2019-06-12 | Disposition: A | Discharge status: Home / Self Care | Payer: Medicaid Other | Attending: Emergency Medicine | Admitting: Emergency Medicine

## 2019-06-12 ENCOUNTER — Ambulatory Visit (INDEPENDENT_AMBULATORY_CARE_PROVIDER_SITE_OTHER): Payer: Medicaid Other

## 2019-06-12 DIAGNOSIS — R05 Cough: Secondary | ICD-10-CM

## 2019-06-12 DIAGNOSIS — J45901 Unspecified asthma with (acute) exacerbation: Secondary | ICD-10-CM | POA: Diagnosis present

## 2019-06-12 DIAGNOSIS — Z20822 Contact with and (suspected) exposure to covid-19: Secondary | ICD-10-CM

## 2019-06-12 DIAGNOSIS — R0602 Shortness of breath: Secondary | ICD-10-CM

## 2019-06-12 MED ORDER — AEROCHAMBER PLUS FLO-VU LARGE MISC
Status: AC
  Filled 2019-06-12: qty 1

## 2019-06-12 MED ORDER — ALBUTEROL SULFATE HFA 108 (90 BASE) MCG/ACT IN AERS
4.0000 | INHALATION_SPRAY | RESPIRATORY_TRACT | Status: DC | PRN
  Administered 2019-06-12 (×2): 4 via RESPIRATORY_TRACT

## 2019-06-12 MED ORDER — AEROCHAMBER PLUS FLO-VU LARGE MISC
1.0000 | Freq: Once | Status: AC
  Administered 2019-06-12: 1

## 2019-06-12 MED ORDER — ALBUTEROL SULFATE HFA 108 (90 BASE) MCG/ACT IN AERS
INHALATION_SPRAY | RESPIRATORY_TRACT | Status: AC
  Filled 2019-06-12: qty 6.7

## 2019-06-12 MED ORDER — IBUPROFEN 600 MG PO TABS: 600.0000 mg | ORAL_TABLET | Freq: Four times a day (QID) | ORAL | 0 refills | Status: DC | PRN

## 2019-06-12 MED ORDER — PREDNISONE 50 MG PO TABS: 50.0000 mg | ORAL_TABLET | Freq: Every day | ORAL | 0 refills | Status: DC

## 2019-06-12 NOTE — ED Provider Notes (Signed)
HPI  SUBJECTIVE:  Stacy Mcgee is a 24 y.o. female who presents with 1 week of chest tightness and shortness of breath described as "my chest closing up".  She reports nonmigratory nonradiating substernal sharp chest pain with deep inspiration only.  She reports shortness of breath with exertion.  She reports a dry cough while trying to catch her breath.  Ran out of albuterol 5 days ago.  She has been trying steam therapy and Primatene Mist since.  Her last dose of Primatene was immediately before evaluation.  Steam and Primatene help.  Symptoms are worse with exertion and with exposure to pulmonary irritants like bleach fumes.  She states that she has had identical symptoms with previous asthma exacerbations.  No antibiotics in the past 3 months.  No antipyretic in the past 4 to 6 hours.  no fevers body aches headaches nasal congestion sore throat loss of sense of smell or taste nausea vomiting diarrhea abdominal pain.  No known Covid exposure.  No unintentional weight gain lower extremity edema orthopnea PND nocturia.  Patient went to the ED on 3/17 for chest pain or shortness of breath.  Troponin negative, BMP unremarkable, hemoglobin 13.. no leukocytosis, negative beta quant.  Chest x-ray showed bilateral diffuse hazy opacities concerning for atypical infection or developing pulmonary edema.  She left without being seen by provider.   He has a past medical history of asthma with last admission in 2016, no intubations or recent steroids.  She is a smoker.  No history of CHF MI hypercholesterolemia coronary artery disease PE DVT cancer chronic kidney disease immunocompromise HIV.  LMP: Now.  Denies the possibility of being pregnant.  PMD: Eldridge Abrahams at [facility].  Past Medical History:  Diagnosis Date  . Asthma   . Eczema   . Generalized headaches   . Seasonal allergies     History reviewed. No pertinent surgical history.  Family History  Problem Relation Age of  Onset  . Bipolar disorder Mother   . Depression Mother   . Migraines Father   . ADD / ADHD Sister   . Migraines Maternal Aunt   . Cancer Maternal Grandfather   . Heart Problems Paternal Grandfather     Social History   Tobacco Use  . Smoking status: Current Every Day Smoker    Types: Cigarettes  . Smokeless tobacco: Never Used  . Tobacco comment: 4 cigarettes per day  Substance Use Topics  . Alcohol use: Yes    Comment: socailly  . Drug use: No    No current facility-administered medications for this encounter.  Current Outpatient Medications:  .  ibuprofen (ADVIL) 600 MG tablet, Take 1 tablet (600 mg total) by mouth every 6 (six) hours as needed., Disp: 30 tablet, Rfl: 0 .  lidocaine (XYLOCAINE) 5 % ointment, Apply 1 application topically as needed., Disp: 35.44 g, Rfl: 0 .  ondansetron (ZOFRAN ODT) 4 MG disintegrating tablet, Take 1 tablet (4 mg total) by mouth every 8 (eight) hours as needed for nausea or vomiting., Disp: 20 tablet, Rfl: 0 .  predniSONE (DELTASONE) 50 MG tablet, Take 1 tablet (50 mg total) by mouth daily with breakfast., Disp: 5 tablet, Rfl: 0 .  SUMAtriptan (IMITREX) 50 MG tablet, Take 1 tablet (50 mg total) by mouth every 2 (two) hours as needed for migraine. May repeat in 2 hours if headache persists or recurs., Disp: 10 tablet, Rfl: 0  Allergies  Allergen Reactions  . Peanut-Containing Drug Products Anaphylaxis  ROS  As noted in HPI.   Physical Exam  BP 109/77 (BP Location: Left Arm)   Pulse (!) 120   Temp 98.6 F (37 C) (Oral)   Resp 20   LMP 05/21/2019 (Approximate)   SpO2 98%   Constitutional: Well developed, well nourished, no acute distress Eyes: PERRL, EOMI, conjunctiva normal bilaterally HENT: Normocephalic, atraumatic,mucus membranes moist Respiratory: Poor air movement.  Clear to auscultation bilaterally, no rales, no wheezing, no rhonchi.  Positive anterior chest wall tenderness that she states reproduces her chest  pain. Cardiovascular: Regular tachycardia, no murmurs, no gallops, no rubs Neck: No JVD GI: Soft, nondistended, normal bowel sounds, nontender, no rebound, no guarding.  No tender hepatomegaly skin: No rash, skin intact Musculoskeletal: Calves symmetric nontender no edema no palpable cord Neurologic: Alert & oriented x 3, CN III-XII grossly intact, no motor deficits, sensation grossly intact Psychiatric: Speech and behavior appropriate   ED Course   Medications  AeroChamber Plus Flo-Vu Large MISC 1 each (1 each Other Given 06/12/19 1250)    Orders Placed This Encounter  Procedures  . SARS CORONAVIRUS 2 (TAT 6-24 HRS) Nasopharyngeal Nasopharyngeal Swab    Standing Status:   Standing    Number of Occurrences:   1    Order Specific Question:   Is this test for diagnosis or screening    Answer:   Screening    Order Specific Question:   Symptomatic for COVID-19 as defined by CDC    Answer:   No    Order Specific Question:   Hospitalized for COVID-19    Answer:   No    Order Specific Question:   Admitted to ICU for COVID-19    Answer:   No    Order Specific Question:   Previously tested for COVID-19    Answer:   Yes    Order Specific Question:   Resident in a congregate (group) care setting    Answer:   No    Order Specific Question:   Employed in healthcare setting    Answer:   No    Order Specific Question:   Pregnant    Answer:   No  . DG Chest 2 View    Standing Status:   Standing    Number of Occurrences:   1    Order Specific Question:   Reason for Exam (SYMPTOM  OR DIAGNOSIS REQUIRED)    Answer:   suspect covid eval for acute changes  . ED EKG    Standing Status:   Standing    Number of Occurrences:   1    Order Specific Question:   Reason for Exam    Answer:   Shortness of breath   No results found for this or any previous visit (from the past 24 hour(s)). No results found.  ED Clinical Impression  1. Moderate asthma with acute exacerbation, unspecified whether  persistent   2. Suspected COVID-19 virus infection      ED Assessment/Plan  Reviewed ER labs.  As noted in HPI  Suspect Covid infection aggravating her asthma.  Tachycardia most likely due to the Primatene that she took immediately prior to evaluation.  Doubt PE.  She does not appear to be fluid overloaded-doubt CHF.  Checking EKG, repeat x-ray, Covid PCR.  Will give her 4 puffs from an albuterol inhaler using a spacer and reevaluate.  EKG: Normal sinus rhythm rate 96.  Right axis deviation.  Normal intervals.  No hypertrophy.  No ST-T wave changes concerning  for ischemia.  EKG unchanged compared to EKG from 06/10/2019  Reviewed imaging from 3/17  independently.  Diffuse hazy bilateral airspace opacities concerning for atypical infectious process or developing pulmonary edema. See radiology report for full details.  Reviewed today's imaging independently.  Mild hazy opacity at the lung bases, improvement in aeration no new consolidations no pleural effusions pulmonary edema.  See radiology report for details.  EKG is not tachycardic no ischemic changes, no evidence of PE.  x-ray is improving.  vitals are normal other than the tachycardia which I suspect was from the Primatene Mist.  No antipyretic that would mask a fever.  With holding antibiotics as I think this is Covid.  On reevaluation, patient states she feels, much better.  Chest pain/tightness is completely gone.  We will send home with 50 mg of prednisone for 5 days 2 puffs from her albuterol inhaler every 4 hours for the next 2 days, then every 6 hours for 2 days after that.  Then as needed.  Ibuprofen 600 mg combined with 1000 mg of Tylenol 3-4 times a day as needed for chest wall pain.  She will need to follow-up with her primary care physician in several days if she is not getting any better.  To the ER if she gets worse.   Discussed labs, imaging, MDM, treatment plan, and plan for follow-up with patient Discussed sn/sx that should  prompt return to the ED. patient agrees with plan.   Meds ordered this encounter  Medications  . DISCONTD: albuterol (VENTOLIN HFA) 108 (90 Base) MCG/ACT inhaler 4 puff  . AeroChamber Plus Flo-Vu Large MISC 1 each  . predniSONE (DELTASONE) 50 MG tablet    Sig: Take 1 tablet (50 mg total) by mouth daily with breakfast.    Dispense:  5 tablet    Refill:  0  . ibuprofen (ADVIL) 600 MG tablet    Sig: Take 1 tablet (600 mg total) by mouth every 6 (six) hours as needed.    Dispense:  30 tablet    Refill:  0    *This clinic note was created using Scientist, clinical (histocompatibility and immunogenetics). Therefore, there may be occasional mistakes despite careful proofreading.  ?    Domenick Gong, MD 06/14/19 1649

## 2019-06-12 NOTE — ED Triage Notes (Signed)
Pt c/o SOB, cough for approx 1 week. Reports that she no longer has a nebulizer at home and has been using steam therapy to help with her breathing. Pt states when she ran out of albuterol inhaler, she bought/used primatene OTC inhaler--used for past 4 days. Used inhaler in lobby while waiting.  Pt states she has chest pain "because it's been hard to breathe". Reports that she went to ER two days ago, had CXR completed but then left before being seen by medical provider 2/2 "waiting time".  Pt states concern of results of CXR in myChart. Denies any sore throat, HA, congestion.  Able to speak full sentences quickly w/o difficulty.  Pt also reports vaginal bleeding for approx 2 months of "heavy bleeding", left abdom pain. Denies abnormal vag discharge.

## 2019-06-12 NOTE — Discharge Instructions (Addendum)
Your Covid test will be back in 24 hours.  We will contact you if it is positive.  Unfortunately, you will not be in infusion clinic candidate.  50 mg of prednisone for 5 days 2 puffs from your albuterol inhaler every 4 hours for the next 2 days, then every 6 hours for 2 days after that.  Then as needed.  Ibuprofen 600 mg combined with 1000 mg of Tylenol 3-4 times a day as needed for chest wall pain.

## 2019-06-13 LAB — SARS CORONAVIRUS 2 (TAT 6-24 HRS): SARS Coronavirus 2: NEGATIVE

## 2020-03-14 ENCOUNTER — Encounter (HOSPITAL_COMMUNITY): Payer: Self-pay | Admitting: Family Medicine

## 2020-03-14 ENCOUNTER — Other Ambulatory Visit: Payer: Self-pay

## 2020-03-14 ENCOUNTER — Ambulatory Visit (HOSPITAL_COMMUNITY)
Admission: EM | Admit: 2020-03-14 | Discharge: 2020-03-14 | Disposition: A | Discharge status: Home / Self Care | Payer: Medicaid Other | Attending: Family Medicine | Admitting: Family Medicine

## 2020-03-14 DIAGNOSIS — M436 Torticollis: Secondary | ICD-10-CM

## 2020-03-14 MED ORDER — PREDNISONE 50 MG PO TABS: 50.0000 mg | ORAL_TABLET | Freq: Every day | ORAL | 0 refills | Status: DC

## 2020-03-14 MED ORDER — HYDROCODONE-ACETAMINOPHEN 5-325 MG PO TABS: 1.0000 | ORAL_TABLET | Freq: Four times a day (QID) | ORAL | 0 refills | Status: DC | PRN

## 2020-03-14 NOTE — ED Triage Notes (Signed)
Pt c/o shoulder and neck pain. Pt states she pulled a muscle. She states she feels tightness on her chest and has pain on her right side.

## 2020-03-14 NOTE — Discharge Instructions (Signed)
Apply heat to the back of your neck and do the exercises that we discussed.

## 2020-03-14 NOTE — ED Provider Notes (Signed)
[facility]    CSN: [id number] Arrival date & time: 03/14/20  1623      History   Chief Complaint Chief Complaint  Patient presents with  . Neck Pain    HPI Stacy Mcgee is a 24 y.o. female.   Is a 24 year old woman who presents to the [facility] urgent care for an established visit.  She is complaining about neck and shoulder pain.  Pt c/o shoulder and neck pain. Pt states she pulled a muscle. She states she feels tightness on her chest and has pain on her right side.   Onset of pain was gradual with waking up 3 days ago having some neck stiffness.  She has been applying ice and he takes it been getting worse.  The pain is described as sharp and lancinating.  She has no weakness of her arms and no numbness.     Past Medical History:  Diagnosis Date  . Asthma   . Eczema   . Generalized headaches   . Seasonal allergies     Patient Active Problem List   Diagnosis Date Noted  . Migraine without aura and without status migrainosus, not intractable 03/04/2015  . Tension headache 03/04/2015  . Sleeping difficulty 03/04/2015    History reviewed. No pertinent surgical history.  OB History   No obstetric history on file.      Home Medications    Prior to Admission medications   Medication Sig Start Date End Date Taking? Authorizing Provider  HYDROcodone-acetaminophen (NORCO) 5-325 MG tablet Take 1 tablet by mouth every 6 (six) hours as needed for moderate pain. 03/14/20   Elvina Sidle, MD  ibuprofen (ADVIL) 600 MG tablet Take 1 tablet (600 mg total) by mouth every 6 (six) hours as needed. 06/12/19   Domenick Gong, MD  lidocaine (XYLOCAINE) 5 % ointment Apply 1 application topically as needed. 11/20/18   Lamptey, Britta Mccreedy, MD  predniSONE (DELTASONE) 50 MG tablet Take 1 tablet (50 mg total) by mouth daily with breakfast. Take 1st dose tonight 03/14/20   Elvina Sidle, MD  SUMAtriptan (IMITREX) 50 MG tablet Take 1 tablet (50 mg total) by  mouth every 2 (two) hours as needed for migraine. May repeat in 2 hours if headache persists or recurs. 05/09/19   Gailen Shelter, PA  albuterol (PROVENTIL HFA;VENTOLIN HFA) 108 (90 Base) MCG/ACT inhaler Inhale 1-2 puffs into the lungs every 6 (six) hours as needed for wheezing or shortness of breath. 05/20/18 06/12/19  Orland Mustard, MD    Family History Family History  Problem Relation Age of Onset  . Bipolar disorder Mother   . Depression Mother   . Migraines Father   . ADD / ADHD Sister   . Migraines Maternal Aunt   . Cancer Maternal Grandfather   . Heart Problems Paternal Grandfather     Social History Social History   Tobacco Use  . Smoking status: Current Every Day Smoker    Types: Cigarettes  . Smokeless tobacco: Never Used  . Tobacco comment: 4 cigarettes per day  Vaping Use  . Vaping Use: Never used  Substance Use Topics  . Alcohol use: Yes    Comment: socailly  . Drug use: No     Allergies   Peanut-containing drug products   Review of Systems Review of Systems  Constitutional: Negative.   Musculoskeletal: Positive for neck pain.     Physical Exam Triage Vital Signs ED Triage Vitals  Enc Vitals Group     BP  Pulse      Resp      Temp      Temp src      SpO2      Weight      Height      Head Circumference      Peak Flow      Pain Score      Pain Loc      Pain Edu?      Excl. in GC?    No data found.  Updated Vital Signs BP 116/77 (BP Location: Left Arm)   Pulse 69   Temp 98.2 F (36.8 C) (Oral)   Resp 18   LMP 02/22/2020 (Approximate)   SpO2 100%   Physical Exam Vitals and nursing note reviewed.  Constitutional:      Appearance: Normal appearance. She is normal weight.  HENT:     Mouth/Throat:     Mouth: Mucous membranes are moist.  Eyes:     Conjunctiva/sclera: Conjunctivae normal.  Neck:     Comments: Patient is holding her neck tilted to the right side. Cardiovascular:     Rate and Rhythm: Normal rate.  Pulmonary:      Effort: Pulmonary effort is normal.  Musculoskeletal:        General: No swelling, tenderness or signs of injury.     Cervical back: No tenderness.  Skin:    General: Skin is warm and dry.  Neurological:     General: No focal deficit present.     Mental Status: She is alert.  Psychiatric:        Mood and Affect: Mood normal.      UC Treatments / Results  Labs (all labs ordered are listed, but only abnormal results are displayed) Labs Reviewed - No data to display  EKG   Radiology No results found.  Procedures Procedures (including critical care time)  Medications Ordered in UC Medications - No data to display  Initial Impression / Assessment and Plan / UC Course  I have reviewed the triage vital signs and the nursing notes.  Pertinent labs & imaging results that were available during my care of the patient were reviewed by me and considered in my medical decision making (see chart for details).    Final Clinical Impressions(s) / UC Diagnoses   Final diagnoses:  Torticollis, acute     Discharge Instructions     Apply heat to the back of your neck and do the exercises that we discussed.    ED Prescriptions    Medication Sig Dispense Auth. Provider   predniSONE (DELTASONE) 50 MG tablet Take 1 tablet (50 mg total) by mouth daily with breakfast. Take 1st dose tonight 2 tablet Elvina Sidle, MD   HYDROcodone-acetaminophen (NORCO) 5-325 MG tablet Take 1 tablet by mouth every 6 (six) hours as needed for moderate pain. 6 tablet Elvina Sidle, MD     I have reviewed the PDMP during this encounter.   Elvina Sidle, MD 03/14/20 1727

## 2020-03-15 ENCOUNTER — Other Ambulatory Visit: Payer: Self-pay

## 2020-03-15 ENCOUNTER — Ambulatory Visit (HOSPITAL_COMMUNITY)
Admission: EM | Admit: 2020-03-15 | Discharge: 2020-03-15 | Disposition: A | Discharge status: Home / Self Care | Payer: Medicaid Other

## 2020-03-15 ENCOUNTER — Emergency Department (HOSPITAL_COMMUNITY)
Admission: EM | Admit: 2020-03-15 | Discharge: 2020-03-15 | Disposition: A | Discharge status: Home / Self Care | Payer: Medicaid Other | Attending: Emergency Medicine | Admitting: Emergency Medicine

## 2020-03-15 ENCOUNTER — Encounter (HOSPITAL_COMMUNITY): Payer: Self-pay | Admitting: *Deleted

## 2020-03-15 DIAGNOSIS — M542 Cervicalgia: Secondary | ICD-10-CM

## 2020-03-15 DIAGNOSIS — R200 Anesthesia of skin: Secondary | ICD-10-CM | POA: Insufficient documentation

## 2020-03-15 DIAGNOSIS — M436 Torticollis: Secondary | ICD-10-CM | POA: Diagnosis not present

## 2020-03-15 DIAGNOSIS — R0789 Other chest pain: Secondary | ICD-10-CM | POA: Insufficient documentation

## 2020-03-15 DIAGNOSIS — Z5321 Procedure and treatment not carried out due to patient leaving prior to being seen by health care provider: Secondary | ICD-10-CM | POA: Insufficient documentation

## 2020-03-15 DIAGNOSIS — M549 Dorsalgia, unspecified: Secondary | ICD-10-CM | POA: Diagnosis not present

## 2020-03-15 DIAGNOSIS — R29818 Other symptoms and signs involving the nervous system: Secondary | ICD-10-CM

## 2020-03-15 LAB — I-STAT BETA HCG BLOOD, ED (MC, WL, AP ONLY): I-stat hCG, quantitative: <5 m[IU]/mL (ref <5)

## 2020-03-15 LAB — BASIC METABOLIC PANEL
Anion gap: 12 (ref 5–15)
BUN: 8 mg/dL (ref 6–20)
CO2: 22 mmol/L (ref 22–32)
Calcium: 9.5 mg/dL (ref 8.9–10.3)
Chloride: 106 mmol/L (ref 98–111)
Creatinine, Ser: 0.79 mg/dL (ref 0.44–1.00)
GFR, Estimated: >60 mL/min (ref >60)
Glucose, Bld: 103 mg/dL — ABNORMAL HIGH (ref 70–99)
Potassium: 3.3 mmol/L — ABNORMAL LOW (ref 3.5–5.1)
Sodium: 140 mmol/L (ref 135–145)

## 2020-03-15 LAB — CBC
HCT: 42.8 % (ref 36.0–46.0)
Hemoglobin: 13.5 g/dL (ref 12.0–15.0)
MCH: 28.6 pg (ref 26.0–34.0)
MCHC: 31.5 g/dL (ref 30.0–36.0)
MCV: 90.7 fL (ref 80.0–100.0)
Platelets: 398 10*3/uL (ref 150–400)
RBC: 4.72 MIL/uL (ref 3.87–5.11)
RDW: 13.2 % (ref 11.5–15.5)
WBC: 16.1 10*3/uL — ABNORMAL HIGH (ref 4.0–10.5)
nRBC: 0 % (ref 0.0–0.2)

## 2020-03-15 LAB — TROPONIN I (HIGH SENSITIVITY): Troponin I (High Sensitivity): 3 ng/L (ref <18)

## 2020-03-15 NOTE — ED Provider Notes (Signed)
[name]   MRN: [id number] DOB: October 19, 1995  Subjective:   Stacy Mcgee is a 24 y.o. female presenting for recheck for her neck pain.  Patient states that is the most severe pain she has had.  She was seen yesterday given hydrocodone, prednisone which she took last night and today.  Reports that she is significantly worse today including shock sensations going down her entire spine and feeling weakness and tingling in her wrist and left hand.  She feels like the pain is starting to go down her legs as well.  She does not like to use medications like muscle relaxants and is strongly opposed to using them.  She also did not like the way hydrocodone felt and does not want this again.  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  Initially felt like she had strained her neck or pulled a muscle but has not been responding to any medications and is getting worse.  No current facility-administered medications for this encounter.  Current Outpatient Medications:    HYDROcodone-acetaminophen (NORCO) 5-325 MG tablet, Take 1 tablet by mouth every 6 (six) hours as needed for moderate pain., Disp: 6 tablet, Rfl: 0   ibuprofen (ADVIL) 600 MG tablet, Take 1 tablet (600 mg total) by mouth every 6 (six) hours as needed., Disp: 30 tablet, Rfl: 0   lidocaine (XYLOCAINE) 5 % ointment, Apply 1 application topically as needed., Disp: 35.44 g, Rfl: 0   predniSONE (DELTASONE) 50 MG tablet, Take 1 tablet (50 mg total) by mouth daily with breakfast. Take 1st dose tonight, Disp: 2 tablet, Rfl: 0   SUMAtriptan (IMITREX) 50 MG tablet, Take 1 tablet (50 mg total) by mouth every 2 (two) hours as needed for migraine. May repeat in 2 hours if headache persists or recurs., Disp: 10 tablet, Rfl: 0   Allergies  Allergen Reactions   Peanut-Containing Drug Products Anaphylaxis     Past Medical History:  Diagnosis Date   Asthma    Eczema    Generalized headaches    Seasonal allergies      History reviewed. No pertinent surgical history.  Family History  Problem Relation Age of Onset   Bipolar disorder Mother    Depression Mother    Migraines Father    ADD / ADHD Sister    Migraines Maternal Aunt    Cancer Maternal Grandfather    Heart Problems Paternal Grandfather     Social History   Tobacco Use   Smoking status: Current Every Day Smoker    Types: Cigarettes   Smokeless tobacco: Never Used   Tobacco comment: 4 cigarettes per day  Vaping Use   Vaping Use: Never used  Substance Use Topics   Alcohol use: Yes    Comment: socailly   Drug use: No    ROS   Objective:   Vitals: BP 130/81 (BP Location: Left Arm)    Pulse 83    Temp 98.3 F (36.8 C) (Oral)    Resp 15    LMP 02/22/2020 (Approximate)    SpO2 97%   Physical Exam Constitutional:      General: She is not in acute distress.    Appearance: Normal appearance. She is well-developed. She is not ill-appearing.  HENT:     Head: Normocephalic and atraumatic.     Nose: Nose normal.     Mouth/Throat:  Mouth: Mucous membranes are moist.     Pharynx: Oropharynx is clear.  Eyes:     General: No scleral icterus.    Extraocular Movements: Extraocular movements intact.     Pupils: Pupils are equal, round, and reactive to light.  Cardiovascular:     Rate and Rhythm: Normal rate.  Pulmonary:     Effort: Pulmonary effort is normal.  Musculoskeletal:     Cervical back: Spasms, torticollis and tenderness (Along entire paraspinal muscles of the cervical region, positive Lhermitte sign, significant guarding) present. No swelling, edema, deformity, erythema, signs of trauma, lacerations, rigidity, bony tenderness or crepitus. Pain with movement present. Decreased range of motion.  Skin:    General: Skin is warm and dry.  Neurological:     General: No focal deficit  present.     Mental Status: She is alert and oriented to person, place, and time.  Psychiatric:        Mood and Affect: Mood normal.        Behavior: Behavior normal.       Assessment and Plan :   I have reviewed the PDMP during this encounter.  1. Torticollis   2. Neck pain   3. Lhermitte's sign positive     Patient has severe symptoms that have not responded to high-dose steroid, hydrocodone and she is not interested in trying a muscle relaxant, or opioid medication.  Given physical exam findings, worsening nature of her symptoms recommended evaluation in the emergency room.  Patient will head there now. Note for work provided.    Wallis Bamberg, [place] 03/15/20 1835

## 2020-03-15 NOTE — ED Triage Notes (Signed)
Pt returns today because neck pain is worse on Lt. Pt was seen yesterday for same.

## 2020-03-15 NOTE — Discharge Instructions (Signed)
You have a positive Lhermitte sign, are not getting relief with hydrocodone, prednisone, ibuprofen and still have severe neck pain. This needs further evaluation than we can provide in the urgent care setting including imaging like a CT scan or an MRI. Please head there now.

## 2020-03-15 NOTE — ED Triage Notes (Signed)
Pt presents for complaint of back pain, numbness in L hand, tightness in neck & chest. Says she was seen yesterday & today at [facility], was given prednisone & hydrocodone & says "it's not working." Sent to ED by [facility] for MRI/CT

## 2020-03-15 NOTE — ED Notes (Signed)
Pt called for VS, no response.  °

## 2020-03-17 ENCOUNTER — Emergency Department (HOSPITAL_COMMUNITY): Payer: Medicaid Other

## 2020-03-17 ENCOUNTER — Other Ambulatory Visit: Payer: Self-pay

## 2020-03-17 ENCOUNTER — Encounter (HOSPITAL_COMMUNITY): Payer: Self-pay

## 2020-03-17 ENCOUNTER — Emergency Department (HOSPITAL_COMMUNITY)
Admission: EM | Admit: 2020-03-17 | Discharge: 2020-03-17 | Disposition: A | Discharge status: Home / Self Care | Payer: Medicaid Other | Attending: Emergency Medicine | Admitting: Emergency Medicine

## 2020-03-17 DIAGNOSIS — S161XXA Strain of muscle, fascia and tendon at neck level, initial encounter: Secondary | ICD-10-CM | POA: Diagnosis not present

## 2020-03-17 DIAGNOSIS — F17210 Nicotine dependence, cigarettes, uncomplicated: Secondary | ICD-10-CM | POA: Insufficient documentation

## 2020-03-17 DIAGNOSIS — S29012A Strain of muscle and tendon of back wall of thorax, initial encounter: Secondary | ICD-10-CM | POA: Diagnosis not present

## 2020-03-17 DIAGNOSIS — J45909 Unspecified asthma, uncomplicated: Secondary | ICD-10-CM | POA: Diagnosis not present

## 2020-03-17 DIAGNOSIS — Z79899 Other long term (current) drug therapy: Secondary | ICD-10-CM | POA: Diagnosis not present

## 2020-03-17 DIAGNOSIS — S199XXA Unspecified injury of neck, initial encounter: Secondary | ICD-10-CM | POA: Diagnosis present

## 2020-03-17 DIAGNOSIS — Z91010 Allergy to peanuts: Secondary | ICD-10-CM | POA: Diagnosis not present

## 2020-03-17 DIAGNOSIS — M542 Cervicalgia: Secondary | ICD-10-CM

## 2020-03-17 DIAGNOSIS — X500XXA Overexertion from strenuous movement or load, initial encounter: Secondary | ICD-10-CM | POA: Diagnosis not present

## 2020-03-17 DIAGNOSIS — Y990 Civilian activity done for income or pay: Secondary | ICD-10-CM | POA: Insufficient documentation

## 2020-03-17 LAB — URINALYSIS, ROUTINE W REFLEX MICROSCOPIC
Bacteria, UA: NONE SEEN
Bilirubin Urine: NEGATIVE
Glucose, UA: NEGATIVE mg/dL
Ketones, ur: 5 mg/dL — AB
Leukocytes,Ua: NEGATIVE
Nitrite: NEGATIVE
Protein, ur: NEGATIVE mg/dL
Specific Gravity, Urine: 1.02 (ref 1.005–1.030)
pH: 6 (ref 5.0–8.0)

## 2020-03-17 LAB — PREGNANCY, URINE: Preg Test, Ur: NEGATIVE

## 2020-03-17 MED ORDER — LIDOCAINE 5 % EX PTCH: 1.0000 | MEDICATED_PATCH | CUTANEOUS | 0 refills | Status: DC

## 2020-03-17 MED ORDER — NAPROXEN 500 MG PO TABS: 500.0000 mg | ORAL_TABLET | Freq: Two times a day (BID) | ORAL | 0 refills | Status: DC

## 2020-03-17 MED ORDER — KETOROLAC TROMETHAMINE 60 MG/2ML IM SOLN
30.0000 mg | Freq: Once | INTRAMUSCULAR | Status: AC
  Administered 2020-03-17: 30 mg via INTRAMUSCULAR
  Filled 2020-03-17: qty 2

## 2020-03-17 MED ORDER — PREDNISONE 10 MG (21) PO TBPK: ORAL_TABLET | ORAL | 0 refills | Status: DC

## 2020-03-17 MED ORDER — METHOCARBAMOL 500 MG PO TABS: 500.0000 mg | ORAL_TABLET | Freq: Two times a day (BID) | ORAL | 0 refills | Status: DC

## 2020-03-17 MED ORDER — PREDNISONE 20 MG PO TABS
60.0000 mg | ORAL_TABLET | Freq: Once | ORAL | Status: AC
  Administered 2020-03-17: 60 mg via ORAL
  Filled 2020-03-17: qty 3

## 2020-03-17 MED ORDER — LIDOCAINE 5 % EX PTCH
2.0000 | MEDICATED_PATCH | CUTANEOUS | Status: DC
  Administered 2020-03-17: 2 via TRANSDERMAL
  Filled 2020-03-17 (×2): qty 2

## 2020-03-17 NOTE — Discharge Instructions (Addendum)
  Expect your soreness to increase over the next 2-3 days. Take it easy, but do not lay around too much as this may make any stiffness worse.  Antiinflammatory medications: Take 600 mg of ibuprofen every 6 hours or 440 mg (over the counter dose) to 500 mg (prescription dose) of naproxen every 12 hours for the next 3 days. After this time, these medications may be used as needed for pain. Take these medications with food to avoid upset stomach. Choose only one of these medications, do not take them together. Acetaminophen (generic for Tylenol): Should you continue to have additional pain while taking the ibuprofen or naproxen, you may add in acetaminophen as needed. Your daily total maximum amount of acetaminophen from all sources should be limited to 4000mg /day for persons without liver problems, or 2000mg /day for those with liver problems. Methocarbamol: Methocarbamol (generic for Robaxin) is a muscle relaxer and can help relieve stiff muscles or muscle spasms.  Do not drive or perform other dangerous activities while taking this medication as it can cause drowsiness as well as changes in reaction time and judgement. Lidocaine patches: These are available via either prescription or over-the-counter. The over-the-counter option may be more economical one and are likely just as effective. There are multiple over-the-counter brands, such as Salonpas. Ice: May apply ice to the area over the next 24 hours for 15 minutes at a time to reduce pain, inflammation, and swelling, if present. Exercises: Be sure to perform the attached exercises starting with three times a week and working up to performing them daily. This is an essential part of preventing long term problems.  Follow up: Follow up with a primary care provider for any future management of these complaints. Be sure to follow up within 7-10 days. Return: Return to the ED should symptoms worsen or you begin to have weakness, complete loss of sensation, or  any other major concerns.  For prescription assistance, may try using prescription discount sites or apps, such as goodrx.com

## 2020-03-17 NOTE — ED Notes (Signed)
Patient discharged, vetrbalized understanding of DC instructions.  ambulated to [facility]   4/ 10 pain

## 2020-03-17 NOTE — ED Triage Notes (Signed)
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 Reports pain that radiates to her chest and numbness in her hands, seen at [facility] and sent here for further imaging.

## 2020-03-17 NOTE — ED Provider Notes (Signed)
Stacy Mcgee is a 24 y.o. female, presenting to the ED with neck and upper back pain as well as paresthesias to the left hand for the last several days.    HPI from Terance Hart, PA-C: "Stacy Mcgee is a 24 y.o. female who presents with neck and shoulder pain.  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  The patient normally can help her when she lifts them but her legs gave out and pulled on her neck and upper back.  She started to have muscle stiffness and soreness in the right trapezius area.  This has been gradually worsening and now she is having pain that shoots around to her chest and sometimes to her lower back.  She sometimes has paresthesias shooting down her left arm into her thumb and down her right leg.  She went to urgent care and was given 2 doses of prednisone and oxycodone.  She states that initially this helped her feel better however symptoms have now started to worsen.  She does not like the way oxycodone or muscle relaxers make her feel and does not want to take this.  She has been taking ibuprofen with mild relief.  She went back to urgent care was referred to the ED for further imaging and evaluation.  She denies arm or leg weakness or inability to walk.  There is been no bowel or bladder incontinence"  Past Medical History:  Diagnosis Date  . Asthma   . Eczema   . Generalized headaches   . Seasonal allergies     Physical Exam  BP (!) 129/91   Pulse 71   Temp 98.1 F (36.7 C) (Oral)   Resp 16   Ht 5\' 3"  (1.6 m)   Wt 95.3 kg   LMP 02/22/2020 (Approximate)   SpO2 100%   BMI 37.20 kg/m   Physical Exam Vitals and nursing note reviewed.  Constitutional:      General: She is not in acute distress.    Appearance: She is well-developed and well-nourished. She is not diaphoretic.  HENT:     Head: Normocephalic and atraumatic.  Eyes:     Conjunctiva/sclera: Conjunctivae normal.  Cardiovascular:     Rate and Rhythm: Normal  rate and regular rhythm.     Pulses:          Radial pulses are 2+ on the right side and 2+ on the left side.  Pulmonary:     Effort: Pulmonary effort is normal.  Musculoskeletal:     Cervical back: Neck supple.     Comments: Full range of motion in the left shoulder, elbow, wrist, and fingers.  Tenderness in the left trapezius and left side of the cervical musculature.  No noted deformity, instability, swelling.  Skin:    General: Skin is warm and dry.     Capillary Refill: Capillary refill takes less than 2 seconds.     Coloration: Skin is not pale.  Neurological:     Mental Status: She is alert.     Comments: Sensation grossly intact to light touch through each of the nerve distributions of the bilateral upper extremities. Abduction and adduction of the fingers intact against resistance. Grip strength equal bilaterally. Supination and pronation intact against resistance. Strength 5/5 through the cardinal directions of the bilateral wrists. Strength 5/5 with flexion and extension of the bilateral elbows. Patient can touch the thumb to each one of the fingertips without difficulty.  Patient can  hold the "OK" sign against resistance.  Psychiatric:        Mood and Affect: Mood and affect normal.        Behavior: Behavior normal.     ED Course/Procedures     Procedures   Abnormal Labs Reviewed  URINALYSIS, ROUTINE W REFLEX MICROSCOPIC - Abnormal; Notable for the following components:      Result Value   APPearance CLOUDY (*)    Hgb urine dipstick SMALL (*)    Ketones, ur 5 (*)    All other components within normal limits    CT Cervical Spine Wo Contrast  Result Date: 03/17/2020 CLINICAL DATA:  Neck and back pain beginning 4 days ago after lifting a patient. EXAM: CT CERVICAL SPINE WITHOUT CONTRAST TECHNIQUE: Multidetector CT imaging of the cervical spine was performed without intravenous contrast. Multiplanar CT image reconstructions were also generated. COMPARISON:  None.  FINDINGS: Alignment: Normal Skull base and vertebrae: Normal Soft tissues and spinal canal: No neck soft tissue lesion. Disc levels: No evidence of degenerative disc disease. No visible disc herniation. No facet arthropathy. Upper chest: Normal Other: None IMPRESSION: Normal CT scan of the cervical spine. No traumatic finding. No evidence of degenerative disc disease or facet arthropathy. Electronically Signed   By: Paulina Fusi M.D.   On: 03/17/2020 16:25   CT Thoracic Spine Wo Contrast  Result Date: 03/17/2020 CLINICAL DATA:  Neck and back pain EXAM: CT THORACIC SPINE WITHOUT CONTRAST TECHNIQUE: Multidetector CT images of the thoracic were obtained using the standard protocol without intravenous contrast. COMPARISON:  None. FINDINGS: Alignment: Normal Vertebrae: Vertebral body heights are well maintained. No fracture, cortical destruction, or osseous lesion. Paraspinal and other soft tissues: Normal appearance to the paraspinal soft tissues and retroperitoneum. Disc levels: No significant canal or neural foraminal narrowing. IMPRESSION: No acute fracture or malalignment. Electronically Signed   By: Jonna Clark M.D.   On: 03/17/2020 16:24    MDM    Patient care handoff report received from [facility], PA-C. Plan: Patient awaiting CT scans.  Patient presents with neck and left upper back pain following an incident where patient pulled and strained her neck and upper back.  She complains of paresthesias in the left hand.  She has no evidence of motor function deficit.  The patient was given instructions for home care as well as return precautions. Patient voices understanding of these instructions, accepts the plan, and is comfortable with discharge.  I reviewed and interpreted the patient's labs and radiological studies.   Anselm Pancoast, PA-C 03/17/20 1704    Terald Sleeper, MD 03/18/20 1003

## 2020-03-17 NOTE — ED Notes (Signed)
Taken to CT.
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
## 2020-05-14 ENCOUNTER — Encounter (HOSPITAL_COMMUNITY): Payer: Self-pay | Admitting: Urgent Care

## 2020-05-14 ENCOUNTER — Other Ambulatory Visit: Payer: Self-pay

## 2020-05-14 ENCOUNTER — Ambulatory Visit (HOSPITAL_COMMUNITY)
Admission: EM | Admit: 2020-05-14 | Discharge: 2020-05-14 | Disposition: A | Discharge status: Home / Self Care | Payer: Medicaid Other | Attending: Urgent Care | Admitting: Urgent Care

## 2020-05-14 DIAGNOSIS — R21 Rash and other nonspecific skin eruption: Secondary | ICD-10-CM

## 2020-05-14 DIAGNOSIS — L089 Local infection of the skin and subcutaneous tissue, unspecified: Secondary | ICD-10-CM

## 2020-05-14 DIAGNOSIS — L309 Dermatitis, unspecified: Secondary | ICD-10-CM

## 2020-05-14 DIAGNOSIS — Z8619 Personal history of other infectious and parasitic diseases: Secondary | ICD-10-CM

## 2020-05-14 MED ORDER — NAPROXEN 500 MG PO TABS: 500.0000 mg | ORAL_TABLET | Freq: Two times a day (BID) | ORAL | 0 refills | Status: DC

## 2020-05-14 MED ORDER — DOXYCYCLINE HYCLATE 100 MG PO CAPS: 100.0000 mg | ORAL_CAPSULE | Freq: Two times a day (BID) | ORAL | 0 refills | Status: DC

## 2020-05-14 MED ORDER — HYDROXYZINE HCL 25 MG PO TABS: 12.5000 mg | ORAL_TABLET | Freq: Three times a day (TID) | ORAL | 0 refills | Status: DC | PRN

## 2020-05-14 NOTE — ED Triage Notes (Signed)
Pt present a rash that has appeared on her skin with bumps. Pt  States the bumps are painful to the touch. They are located on her face, arm and neck

## 2020-05-14 NOTE — ED Provider Notes (Signed)
[facility]   MRN: [id number] DOB: 08/22/1995  Subjective:   Stacy Mcgee is a 25 y.o. female presenting for several day history of acute onset scattered nodules that come to ahead and rupture.  Areas are painful.  States that she has a history of eczema and has been scratching in multiple different areas.  She actually started out with swelling of the right ring finger about the nail.  Has a history of paronychia and states that they presented the same way.  It actually ended up rupturing on its own and draining extensively.  Unfortunately, she now has spots that have shown up on her thighs bilaterally similar to the other areas on her neck and arms.  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  Denies history of chronic conditions including diabetes, HIV, immunocompromising conditions.  No current facility-administered medications for this encounter.  Current Outpatient Medications:  .  lidocaine (LIDODERM) 5 %, Place 1 patch onto the skin daily. Remove & Discard patch within 12 hours or as directed by MD, Disp: 30 patch, Rfl: 0 .  methocarbamol (ROBAXIN) 500 MG tablet, Take 1 tablet (500 mg total) by mouth 2 (two) times daily., Disp: 20 tablet, Rfl: 0 .  naproxen (NAPROSYN) 500 MG tablet, Take 1 tablet (500 mg total) by mouth 2 (two) times daily with a meal. 1 tablet 2 times daily as needed after first few days., Disp: 30 tablet, Rfl: 0 .  predniSONE (STERAPRED UNI-PAK 21 TAB) 10 MG (21) TBPK tablet, Take 6 tabs (60mg ) day 1, 5 tabs (50mg ) day 2, 4 tabs (40mg ) day 3, 3 tabs (30mg ) day 4, 2 tabs (20mg ) day 5, and 1 tab (10mg ) day 6., Disp: 21 tablet, Rfl: 0 .  SUMAtriptan (IMITREX) 50 MG tablet, Take 1 tablet (50 mg total) by mouth every 2 (two) hours as needed for migraine. May repeat in 2 hours if headache persists or recurs. (Patient not taking: Reported on 03/17/2020), Disp: 10 tablet, Rfl: 0   Allergies  Allergen Reactions  . Peanut-Containing Drug Products  Anaphylaxis    Past Medical History:  Diagnosis Date  . Asthma   . Eczema   . Generalized headaches   . Seasonal allergies      No past surgical history on file.  Family History  Problem Relation Age of Onset  . Bipolar disorder Mother   . Depression Mother   . Migraines Father   . ADD / ADHD Sister   . Migraines Maternal Aunt   . Cancer Maternal Grandfather   . Heart Problems Paternal Grandfather     Social History   Tobacco Use  . Smoking status: Current Every Day Smoker    Types: Cigarettes  . Smokeless tobacco: Never Used  . Tobacco comment: 4 cigarettes per day  Vaping Use  . Vaping Use: Never used  Substance Use Topics  . Alcohol use: Yes    Comment: socailly  . Drug use: No    ROS   Objective:   Vitals: BP 126/83 (BP Location: Right Arm)   Pulse 73   Temp 98.6 F (37 C) (Oral)   Resp 16   SpO2 100%   Physical Exam Constitutional:      General: She is not in acute distress.    Appearance: Normal appearance. She is well-developed. She is obese. She is not ill-appearing, toxic-appearing or diaphoretic.  HENT:     Head: Normocephalic and atraumatic.     Nose: Nose normal.  Mouth/Throat:     Mouth: Mucous membranes are moist.     Pharynx: Oropharynx is clear.  Eyes:     General: No scleral icterus.    Extraocular Movements: Extraocular movements intact.     Pupils: Pupils are equal, round, and reactive to light.  Cardiovascular:     Rate and Rhythm: Normal rate.  Pulmonary:     Effort: Pulmonary effort is normal.  Musculoskeletal:       Hands:  Skin:    General: Skin is warm.     Findings: Rash (multiple nodular type lesions ~1/2-1cm in size scantly scattered over forarms bilaterally, left upper arm, left side of neck and medial aspect of her thighs bilaterally) present.  Neurological:     General: No focal deficit present.     Mental Status: She is alert and oriented to person, place, and time.  Psychiatric:        Mood and Affect:  Mood normal.        Behavior: Behavior normal.        Thought Content: Thought content normal.        Judgment: Judgment normal.     Assessment and Plan :   PDMP not reviewed this encounter.  1. Skin infection   2. Rash and nonspecific skin eruption   3. History of staph infection   4. Eczema, unspecified type     Start doxycycline to address recurrent staph infection.  Recommended naproxen for pain and inflammation.  Use hydroxyzine for itching. Counseled patient on potential for adverse effects with medications prescribed/recommended today, ER and return-to-clinic precautions discussed, patient verbalized understanding.    Wallis Bamberg, PA-C 05/14/20 1243

## 2020-07-01 ENCOUNTER — Ambulatory Visit (HOSPITAL_COMMUNITY)
Admission: EM | Admit: 2020-07-01 | Discharge: 2020-07-01 | Disposition: A | Discharge status: Home / Self Care | Payer: Medicaid Other | Attending: Medical Oncology | Admitting: Medical Oncology

## 2020-07-01 ENCOUNTER — Encounter (HOSPITAL_COMMUNITY): Payer: Self-pay

## 2020-07-01 ENCOUNTER — Other Ambulatory Visit: Payer: Self-pay

## 2020-07-01 DIAGNOSIS — N898 Other specified noninflammatory disorders of vagina: Secondary | ICD-10-CM | POA: Diagnosis present

## 2020-07-01 DIAGNOSIS — N912 Amenorrhea, unspecified: Secondary | ICD-10-CM | POA: Diagnosis present

## 2020-07-01 LAB — POC URINE PREG, ED: Preg Test, Ur: NEGATIVE

## 2020-07-01 MED ORDER — FLUCONAZOLE 150 MG PO TABS: 150.0000 mg | ORAL_TABLET | Freq: Every day | ORAL | 0 refills | Status: DC

## 2020-07-01 NOTE — ED Triage Notes (Signed)
Pt c/o possible yeast infection. She states she noticed thick discharge and states she has been feeling itchy. Pt states she is also two weeks late on her cycle and is requesting a pregnancy test.

## 2020-07-01 NOTE — ED Provider Notes (Signed)
[facility]    CSN: [id number] Arrival date & time: 07/01/20  5284      History   Chief Complaint Chief Complaint  Patient presents with  . Vaginal Discharge  . Vaginal Itching  . Possible Pregnancy    HPI Stacy Mcgee is a 25 y.o. female.   HPI  Vaginal Discharge: Pt states that over the past 3 days she has had a thick white vaginal discharge with associated vaginal itching. She denies pelvic pain, abdominal pain, vomiting or fevers. She does report that she is 2 weeks late on her menstrual cycle and asks for a urine pregnancy test. She is hoping for pregnancy- this would be her first pregancy. She is not taking a prenatal vitamin.    Past Medical History:  Diagnosis Date  . Asthma   . Eczema   . Generalized headaches   . Seasonal allergies     Patient Active Problem List   Diagnosis Date Noted  . Migraine without aura and without status migrainosus, not intractable 03/04/2015  . Tension headache 03/04/2015  . Sleeping difficulty 03/04/2015    History reviewed. No pertinent surgical history.  OB History   No obstetric history on file.      Home Medications    Prior to Admission medications   Medication Sig Start Date End Date Taking? Authorizing Provider  doxycycline (VIBRAMYCIN) 100 MG capsule Take 1 capsule (100 mg total) by mouth 2 (two) times daily. 05/14/20   Wallis Bamberg, PA-C  hydrOXYzine (ATARAX/VISTARIL) 25 MG tablet Take 0.5-1 tablets (12.5-25 mg total) by mouth every 8 (eight) hours as needed for itching. 05/14/20   Wallis Bamberg, PA-C  lidocaine (LIDODERM) 5 % Place 1 patch onto the skin daily. Remove & Discard patch within 12 hours or as directed by MD 03/17/20   Joy, Shawn C, PA-C  methocarbamol (ROBAXIN) 500 MG tablet Take 1 tablet (500 mg total) by mouth 2 (two) times daily. 03/17/20   Joy, Shawn C, PA-C  naproxen (NAPROSYN) 500 MG tablet Take 1 tablet (500 mg total) by mouth 2 (two) times daily with a meal. 1 tablet 2 times daily  as needed after first few days. 05/14/20   Wallis Bamberg, PA-C  predniSONE (STERAPRED UNI-PAK 21 TAB) 10 MG (21) TBPK tablet Take 6 tabs (60mg ) day 1, 5 tabs (50mg ) day 2, 4 tabs (40mg ) day 3, 3 tabs (30mg ) day 4, 2 tabs (20mg ) day 5, and 1 tab (10mg ) day 6. 03/17/20   Joy, Shawn C, PA-C  SUMAtriptan (IMITREX) 50 MG tablet Take 1 tablet (50 mg total) by mouth every 2 (two) hours as needed for migraine. May repeat in 2 hours if headache persists or recurs. Patient not taking: Reported on 03/17/2020 05/09/19   , PA  albuterol (PROVENTIL HFA;VENTOLIN HFA) 108 (90 Base) MCG/ACT inhaler Inhale 1-2 puffs into the lungs every 6 (six) hours as needed for wheezing or shortness of breath. 05/20/18 06/12/19  03/19/20, MD    Family History Family History  Problem Relation Age of Onset  . Bipolar disorder Mother   . Depression Mother   . Migraines Father   . ADD / ADHD Sister   . Migraines Maternal Aunt   . Cancer Maternal Grandfather   . Heart Problems Paternal Grandfather     Social History Social History   Tobacco Use  . Smoking status: Current Every Day Smoker    Types: Cigarettes  . Smokeless tobacco: Never Used  . Tobacco comment: 4 cigarettes  per day  Vaping Use  . Vaping Use: Never used  Substance Use Topics  . Alcohol use: Yes    Comment: socailly  . Drug use: No     Allergies   Peanut-containing drug products   Review of Systems Review of Systems  As stated above in HPI Physical Exam Triage Vital Signs ED Triage Vitals  Enc Vitals Group     BP 07/01/20 0843 118/66     Pulse Rate 07/01/20 0843 89     Resp 07/01/20 0843 18     Temp 07/01/20 0843 98.5 F (36.9 C)     Temp Source 07/01/20 0843 Oral     SpO2 07/01/20 0843 99 %     Weight --      Height --      Head Circumference --      Peak Flow --      Pain Score 07/01/20 0842 0     Pain Loc --      Pain Edu? --      Excl. in GC? --    No data found.  Updated Vital Signs BP 118/66 (BP  Location: Left Arm)   Pulse 89   Temp 98.5 F (36.9 C) (Oral)   Resp 18   LMP 05/19/2020 (Exact Date)   SpO2 99%   Physical Exam Vitals and nursing note reviewed.  Constitutional:      Appearance: Normal appearance.  HENT:     Head: Normocephalic and atraumatic.  Cardiovascular:     Rate and Rhythm: Normal rate and regular rhythm.     Heart sounds: Normal heart sounds.  Pulmonary:     Effort: Pulmonary effort is normal.     Breath sounds: Normal breath sounds.  Abdominal:     General: Bowel sounds are normal. There is no distension.     Palpations: Abdomen is soft. There is no mass.     Tenderness: There is no abdominal tenderness. There is no right CVA tenderness, left CVA tenderness, guarding or rebound.     Hernia: No hernia is present.  Genitourinary:    Comments: Pt performs self swab collection Neurological:     Mental Status: She is alert.      UC Treatments / Results  Labs (all labs ordered are listed, but only abnormal results are displayed) Labs Reviewed  POC URINE PREG, ED  CERVICOVAGINAL ANCILLARY ONLY    EKG   Radiology No results found.  Procedures Procedures (including critical care time)  Medications Ordered in UC Medications - No data to display  Initial Impression / Assessment and Plan / UC Course  I have reviewed the triage vital signs and the nursing notes.  Pertinent labs & imaging results that were available during my care of the patient were reviewed by me and considered in my medical decision making (see chart for details).     New. Urine Hcg is negative. Empathized with patient and provided reassurance. I have recommended that she start a prenatal vitamin. Will treat with diflucan for suspected vaginal candidiasis. Discussed.  Final Clinical Impressions(s) / UC Diagnoses   Final diagnoses:  Vaginal discharge  Amenorrhea   Discharge Instructions   None    ED Prescriptions    None     PDMP not reviewed this encounter.    Rushie Chestnut, [place] 07/01/20 [phone]

## 2020-07-04 ENCOUNTER — Telehealth (HOSPITAL_COMMUNITY): Payer: Self-pay | Admitting: Emergency Medicine

## 2020-07-04 LAB — CERVICOVAGINAL ANCILLARY ONLY
Bacterial Vaginitis (gardnerella): POSITIVE — AB
Candida Glabrata: NEGATIVE
Candida Vaginitis: NEGATIVE
Chlamydia: NEGATIVE
Comment: NEGATIVE
Comment: NEGATIVE
Comment: NEGATIVE
Comment: NEGATIVE
Comment: NEGATIVE
Comment: NORMAL
Neisseria Gonorrhea: NEGATIVE
Trichomonas: NEGATIVE

## 2020-07-04 MED ORDER — METRONIDAZOLE 500 MG PO TABS: 500.0000 mg | ORAL_TABLET | Freq: Two times a day (BID) | ORAL | 0 refills | Status: DC

## 2020-08-09 ENCOUNTER — Emergency Department (HOSPITAL_COMMUNITY): Payer: Medicaid Other

## 2020-08-09 ENCOUNTER — Encounter (HOSPITAL_COMMUNITY): Payer: Self-pay | Admitting: Emergency Medicine

## 2020-08-09 ENCOUNTER — Other Ambulatory Visit: Payer: Self-pay

## 2020-08-09 ENCOUNTER — Emergency Department (HOSPITAL_COMMUNITY)
Admission: EM | Admit: 2020-08-09 | Discharge: 2020-08-10 | Disposition: A | Discharge status: Home / Self Care | Payer: Medicaid Other | Attending: Emergency Medicine | Admitting: Emergency Medicine

## 2020-08-09 DIAGNOSIS — Y9367 Activity, basketball: Secondary | ICD-10-CM | POA: Diagnosis not present

## 2020-08-09 DIAGNOSIS — S0993XA Unspecified injury of face, initial encounter: Secondary | ICD-10-CM | POA: Insufficient documentation

## 2020-08-09 DIAGNOSIS — J45909 Unspecified asthma, uncomplicated: Secondary | ICD-10-CM | POA: Insufficient documentation

## 2020-08-09 DIAGNOSIS — W500XXA Accidental hit or strike by another person, initial encounter: Secondary | ICD-10-CM | POA: Diagnosis not present

## 2020-08-09 DIAGNOSIS — F17210 Nicotine dependence, cigarettes, uncomplicated: Secondary | ICD-10-CM | POA: Diagnosis not present

## 2020-08-09 DIAGNOSIS — M542 Cervicalgia: Secondary | ICD-10-CM | POA: Insufficient documentation

## 2020-08-09 DIAGNOSIS — R519 Headache, unspecified: Secondary | ICD-10-CM | POA: Diagnosis not present

## 2020-08-09 NOTE — ED Triage Notes (Signed)
Pt reports playing basketball with her brother today when she got elbowed in the nose. Pt reported a nose bleed which bleeding is now controlled but feels sinus pressure, left nostril is clogged up and facial swelling to left side of nose. Pt also reporting headaches and neck pain since incident.

## 2020-08-09 NOTE — ED Provider Notes (Signed)
  Emergency Medicine Provider in Triage Note   MSE was initiated and I personally evaluated the patient  11:26 PM on Aug 09, 2020 as provider in triage.   Chief Complaint: Facial injury.   HPI  Patient is a 25 y.o. who presents to the ED with complaints of facial injury which occurred earlier [place]. States she was playing basketball and got elbowed in the nose. Had some bleeding. Having pain to the nose, left face, head, and neck. Did have some ringing in the ears that resolved with some nausea as well.  No LOC.   Review of Systems  Positive: Facial pain, headache, neck pain Negative: Syncope, vomiting, seizure  Physical Exam  BP 122/72   Pulse 75   Temp 98.5 F (36.9 C) (Oral)   Ht 5\' 3"  (1.6 m)   Wt 95.3 kg   SpO2 93%   BMI 37.20 kg/m    Gen:   Awake, no distress   HEENT:  Mild swelling to the nose and left inferior orbital region- tender to these locations as well. No active epistaxis, limited triage assessment without otoscope, PERRL, EOMI.  Resp:  Normal effort  Cardiac:  Normal rate  Abd:   Nondistended, nontender  MSK:   Moves extremities without difficulty. Midline C spine tenderness diffusely, no step off or point/focal vertebral tenderness.  Neuro:  Speech clear.   Medical Decision Making   Initiation of care has begun. The patient has been counseled on the process, plan, and necessity for staying for the completion/evaluation, informed that the remainder of the evaluation will be completed by another provider, this initial triage assessment does not replace that evaluation, and the importance of remaining in the ED until their evaluation is complete.   Clinical Impression  Facial injury.         , PA-C 08/09/20 2330    2331, DO 08/10/20 0004

## 2020-08-10 ENCOUNTER — Encounter (HOSPITAL_COMMUNITY): Payer: Self-pay | Admitting: Student

## 2020-08-10 MED ORDER — METHOCARBAMOL 500 MG PO TABS: 500.0000 mg | ORAL_TABLET | Freq: Three times a day (TID) | ORAL | 0 refills | Status: DC | PRN

## 2020-08-10 MED ORDER — NAPROXEN 500 MG PO TABS: 500.0000 mg | ORAL_TABLET | Freq: Two times a day (BID) | ORAL | 0 refills | Status: DC | PRN

## 2020-08-10 NOTE — ED Provider Notes (Signed)
[facility] EMERGENCY DEPARTMENT Provider Note   CSN: [id number] Arrival date & time: 08/09/20  2251     History Chief Complaint  Patient presents with  . Facial Injury    Stacy Mcgee is a 25 y.o. female with a hx of tobacco abuse, asthma, & migraines who presents to the ED with complaints of facial injury that occurred earlier in the day. States she was elbowed in the face/nose while playing basketball with quick onset pain to the face/nose, epistaxis, and some ringing in her ears. Epistaxis resolved, she has since had facial pain, headache, and neck pain. No alleviating/aggravating factors.  Has had some mild nausea, no emesis. Denies visual disturbance, syncope, seizure, weakness, numbness, emesis, or other rea of injury.   HPI     Past Medical History:  Diagnosis Date  . Asthma   . Eczema   . Generalized headaches   . Seasonal allergies     Patient Active Problem List   Diagnosis Date Noted  . Migraine without aura and without status migrainosus, not intractable 03/04/2015  . Tension headache 03/04/2015  . Sleeping difficulty 03/04/2015    History reviewed. No pertinent surgical history.   OB History   No obstetric history on file.     Family History  Problem Relation Age of Onset  . Bipolar disorder Mother   . Depression Mother   . Migraines Father   . ADD / ADHD Sister   . Migraines Maternal Aunt   . Cancer Maternal Grandfather   . Heart Problems Paternal Grandfather     Social History   Tobacco Use  . Smoking status: Current Every Day Smoker    Types: Cigarettes  . Smokeless tobacco: Never Used  . Tobacco comment: 4 cigarettes per day  Vaping Use  . Vaping Use: Never used  Substance Use Topics  . Alcohol use: Yes    Comment: socailly  . Drug use: No    Home Medications Prior to Admission medications   Medication Sig Start Date End Date Taking? Authorizing Provider  metroNIDAZOLE (FLAGYL) 500 MG tablet Take 1  tablet (500 mg total) by mouth 2 (two) times daily. 07/04/20   Lamptey, Britta Mccreedy, MD  doxycycline (VIBRAMYCIN) 100 MG capsule Take 1 capsule (100 mg total) by mouth 2 (two) times daily. 05/14/20   Wallis Bamberg, PA-C  fluconazole (DIFLUCAN) 150 MG tablet Take 1 tablet (150 mg total) by mouth daily. 07/01/20   Rushie Chestnut, PA-C  hydrOXYzine (ATARAX/VISTARIL) 25 MG tablet Take 0.5-1 tablets (12.5-25 mg total) by mouth every 8 (eight) hours as needed for itching. 05/14/20   Wallis Bamberg, PA-C  lidocaine (LIDODERM) 5 % Place 1 patch onto the skin daily. Remove & Discard patch within 12 hours or as directed by MD 03/17/20   Joy, Shawn C, PA-C  methocarbamol (ROBAXIN) 500 MG tablet Take 1 tablet (500 mg total) by mouth 2 (two) times daily. 03/17/20   Joy, Shawn C, PA-C  naproxen (NAPROSYN) 500 MG tablet Take 1 tablet (500 mg total) by mouth 2 (two) times daily with a meal. 1 tablet 2 times daily as needed after first few days. 05/14/20   Wallis Bamberg, PA-C  predniSONE (STERAPRED UNI-PAK 21 TAB) 10 MG (21) TBPK tablet Take 6 tabs (60mg ) day 1, 5 tabs (50mg ) day 2, 4 tabs (40mg ) day 3, 3 tabs (30mg ) day 4, 2 tabs (20mg ) day 5, and 1 tab (10mg ) day 6. 03/17/20   Joy, Shawn C, PA-C  SUMAtriptan (IMITREX)  50 MG tablet Take 1 tablet (50 mg total) by mouth every 2 (two) hours as needed for migraine. May repeat in 2 hours if headache persists or recurs. Patient not taking: Reported on 03/17/2020 05/09/19   Gailen Shelter, PA  albuterol (PROVENTIL HFA;VENTOLIN HFA) 108 (90 Base) MCG/ACT inhaler Inhale 1-2 puffs into the lungs every 6 (six) hours as needed for wheezing or shortness of breath. 05/20/18 06/12/19  Orland Mustard, MD    Allergies    Peanut-containing drug products  Review of Systems   Review of Systems  Constitutional: Negative for chills and fever.  HENT: Positive for nosebleeds.        Positive for facial pain.   Respiratory: Negative for shortness of breath.   Cardiovascular: Negative for chest  pain.  Gastrointestinal: Negative for abdominal pain.  Musculoskeletal: Positive for neck pain.  Neurological: Positive for headaches. Negative for seizures, syncope, weakness and numbness.  All other systems reviewed and are negative.   Physical Exam Updated Vital Signs BP 119/72 (BP Location: Right Arm)   Pulse 69   Temp 98.3 F (36.8 C) (Oral)   Resp 16   Ht  (1.6 m)   Wt 95.3 kg   SpO2 100%   BMI 37.20 kg/m   Physical Exam Vitals and nursing note reviewed.  Constitutional:      General: She is not in acute distress.    Appearance: She is well-developed. She is not toxic-appearing.  HENT:     Head: Normocephalic.     Comments: Mild swelling to the nasal region and to the left inferior orbital area. Question very mild nasal deformity. Tender to palpation over the nose & left maxilla/inferior orbital area. Otherwise no bony facial tenderness. No racoon eyes. No battle sign.     Right Ear: No hemotympanum.     Left Ear: No hemotympanum.     Nose: Nasal tenderness present.     Right Nostril: No foreign body, epistaxis, septal hematoma or occlusion.     Left Nostril: No foreign body, epistaxis, septal hematoma or occlusion.     Mouth/Throat:     Pharynx: Oropharynx is clear. Uvula midline.  Eyes:     General:        Right eye: No discharge.        Left eye: No discharge.     Conjunctiva/sclera: Conjunctivae normal.  Neck:     Comments: Diffuse tenderness to midline & bilateral paraspinal muscles. No point/focal vertebral tenderness of palpable step off.  Cardiovascular:     Rate and Rhythm: Normal rate and regular rhythm.  Pulmonary:     Effort: Pulmonary effort is normal. No respiratory distress.     Breath sounds: Normal breath sounds. No wheezing, rhonchi or rales.  Chest:     Chest wall: No tenderness.  Abdominal:     General: There is no distension.     Palpations: Abdomen is soft.     Tenderness: There is no abdominal tenderness.  Musculoskeletal:      Comments: Moving all extremities.   Skin:    General: Skin is warm and dry.     Findings: No rash.  Neurological:     Mental Status: She is alert.     Comments: Clear speech. Sensation & strength grossly intact x 4. CN III-XII Grossly intact.   Psychiatric:        Behavior: Behavior normal.     ED Results / Procedures / Treatments   Labs (all labs ordered are listed,  but only abnormal results are displayed) Labs Reviewed - No data to display  EKG None  Radiology CT Head Wo Contrast  Result Date: 08/10/2020 CLINICAL DATA:  Facial trauma; Neck trauma, midline tenderness (Age 53-64y); Facial trauma nasal and left inferior orbital tenderness to palpation. EXAM: CT HEAD WITHOUT CONTRAST CT MAXILLOFACIAL WITHOUT CONTRAST CT CERVICAL SPINE WITHOUT CONTRAST TECHNIQUE: Multidetector CT imaging of the head, cervical spine, and maxillofacial structures were performed using the standard protocol without intravenous contrast. Multiplanar CT image reconstructions of the cervical spine and maxillofacial structures were also generated. COMPARISON:  March 17, 2020 and September 15, 2017. FINDINGS: CT HEAD FINDINGS Brain: No evidence of acute infarction, hemorrhage, hydrocephalus, extra-axial collection or mass lesion/mass effect. Vascular: No hyperdense vessel or unexpected calcification. Skull: Normal. Negative for fracture or focal lesion. Other: None. CT MAXILLOFACIAL FINDINGS Osseous: Very subtle rightward angulation of the nasal bone for instance on image 58/5. No displaced fracture or mandibular dislocation. Orbits: Negative. No traumatic or inflammatory finding. Sinuses: Polypoid mucosal thickening in the right maxillary sinus otherwise the paranasal sinuses and mastoid air cells are predominantly clear. Soft tissues: Very mild soft tissue swelling over the left maxilla. CT CERVICAL SPINE FINDINGS Alignment: Reversal of the normal cervical lordosis, commonly positional. Skull base and vertebrae: No acute  fracture. No primary bone lesion or focal pathologic process. Soft tissues and spinal canal: No prevertebral fluid or swelling. No visible canal hematoma. Disc levels:  Preserved Upper chest: Negative. Other: None IMPRESSION: 1. No acute intracranial abnormality. 2. Very subtle rightward angulation of the nasal bone. 3. Very mild soft tissue swelling over the left maxilla. 4. No acute fracture or subluxation of the cervical spine. Electronically Signed   By: Maudry Mayhew MD   On: 08/10/2020 00:37   CT Cervical Spine Wo Contrast  Result Date: 08/10/2020 CLINICAL DATA:  Facial trauma; Neck trauma, midline tenderness (Age 53-64y); Facial trauma nasal and left inferior orbital tenderness to palpation. EXAM: CT HEAD WITHOUT CONTRAST CT MAXILLOFACIAL WITHOUT CONTRAST CT CERVICAL SPINE WITHOUT CONTRAST TECHNIQUE: Multidetector CT imaging of the head, cervical spine, and maxillofacial structures were performed using the standard protocol without intravenous contrast. Multiplanar CT image reconstructions of the cervical spine and maxillofacial structures were also generated. COMPARISON:  March 17, 2020 and September 15, 2017. FINDINGS: CT HEAD FINDINGS Brain: No evidence of acute infarction, hemorrhage, hydrocephalus, extra-axial collection or mass lesion/mass effect. Vascular: No hyperdense vessel or unexpected calcification. Skull: Normal. Negative for fracture or focal lesion. Other: None. CT MAXILLOFACIAL FINDINGS Osseous: Very subtle rightward angulation of the nasal bone for instance on image 58/5. No displaced fracture or mandibular dislocation. Orbits: Negative. No traumatic or inflammatory finding. Sinuses: Polypoid mucosal thickening in the right maxillary sinus otherwise the paranasal sinuses and mastoid air cells are predominantly clear. Soft tissues: Very mild soft tissue swelling over the left maxilla. CT CERVICAL SPINE FINDINGS Alignment: Reversal of the normal cervical lordosis, commonly positional. Skull  base and vertebrae: No acute fracture. No primary bone lesion or focal pathologic process. Soft tissues and spinal canal: No prevertebral fluid or swelling. No visible canal hematoma. Disc levels:  Preserved Upper chest: Negative. Other: None IMPRESSION: 1. No acute intracranial abnormality. 2. Very subtle rightward angulation of the nasal bone. 3. Very mild soft tissue swelling over the left maxilla. 4. No acute fracture or subluxation of the cervical spine. Electronically Signed   By: Maudry Mayhew MD   On: 08/10/2020 00:37   CT Maxillofacial WO CM  Result Date: 08/10/2020 CLINICAL  DATA:  Facial trauma; Neck trauma, midline tenderness (Age 41-64y); Facial trauma nasal and left inferior orbital tenderness to palpation. EXAM: CT HEAD WITHOUT CONTRAST CT MAXILLOFACIAL WITHOUT CONTRAST CT CERVICAL SPINE WITHOUT CONTRAST TECHNIQUE: Multidetector CT imaging of the head, cervical spine, and maxillofacial structures were performed using the standard protocol without intravenous contrast. Multiplanar CT image reconstructions of the cervical spine and maxillofacial structures were also generated. COMPARISON:  March 17, 2020 and September 15, 2017. FINDINGS: CT HEAD FINDINGS Brain: No evidence of acute infarction, hemorrhage, hydrocephalus, extra-axial collection or mass lesion/mass effect. Vascular: No hyperdense vessel or unexpected calcification. Skull: Normal. Negative for fracture or focal lesion. Other: None. CT MAXILLOFACIAL FINDINGS Osseous: Very subtle rightward angulation of the nasal bone for instance on image 58/5. No displaced fracture or mandibular dislocation. Orbits: Negative. No traumatic or inflammatory finding. Sinuses: Polypoid mucosal thickening in the right maxillary sinus otherwise the paranasal sinuses and mastoid air cells are predominantly clear. Soft tissues: Very mild soft tissue swelling over the left maxilla. CT CERVICAL SPINE FINDINGS Alignment: Reversal of the normal cervical lordosis,  commonly positional. Skull base and vertebrae: No acute fracture. No primary bone lesion or focal pathologic process. Soft tissues and spinal canal: No prevertebral fluid or swelling. No visible canal hematoma. Disc levels:  Preserved Upper chest: Negative. Other: None IMPRESSION: 1. No acute intracranial abnormality. 2. Very subtle rightward angulation of the nasal bone. 3. Very mild soft tissue swelling over the left maxilla. 4. No acute fracture or subluxation of the cervical spine. Electronically Signed   By: Maudry Mayhew MD   On: 08/10/2020 00:37    Procedures Procedures   Medications Ordered in ED Medications - No data to display  ED Course  I have reviewed the triage vital signs and the nursing notes.  Pertinent labs & imaging results that were available during my care of the patient were reviewed by me and considered in my medical decision making (see chart for details).    MDM Rules/Calculators/A&P                         Patient presents to the ED with complaints of facial injury. Nontoxic, vitals without significant abnormality.   Additional history obtained:  Additional history obtained from chart review & nursing note review.   Imaging Studies ordered:  I ordered imaging studies which included CT head, maxillofacial, and Cspine, I independently reviewed, formal radiology impression shows:  1. No acute intracranial abnormality. 2. Very subtle rightward angulation of the nasal bone. 3. Very mild soft tissue swelling over the left maxilla. 4. No acute fracture or subluxation of the cervical spine  ED Course:  No head bleed or acute fractures on imaging.  Subtle rightward angulation of the nasal bone noted- no active epistaxis, septal hematoma, or occlusion of the nare. No other obvious signs of injury on exam. Supportive tx. Discussed no nose blowing. ENT follow up. I discussed results, treatment plan, need for follow-up, and return precautions with the patient. Provided  opportunity for questions, patient confirmed understanding and is in agreement with plan.   Findings and plan of care discussed with supervising physician Dr. Lockie Mola who is in agreement.   Portions of this note were generated with Scientist, clinical (histocompatibility and immunogenetics). Dictation errors may occur despite best attempts at proofreading.  Final Clinical Impression(s) / ED Diagnoses Final diagnoses:  Facial injury, initial encounter    Rx / DC Orders ED Discharge Orders  Ordered    naproxen (NAPROSYN) 500 MG tablet  2 times daily PRN        08/10/20 0238    methocarbamol (ROBAXIN) 500 MG tablet  Every 8 hours PRN        08/10/20 0238           Cherly Andersonetrucelli, Asti Mackley R, PA-C 08/10/20 0334    Virgina Norfolkuratolo, Adam, DO 08/10/20 [phone]

## 2020-08-10 NOTE — Discharge Instructions (Signed)
You were seen in the Er today after a facial injury.  Your CT scan did not show a brain bleed or neck fracture.  It did show that your nasal bone is mildly angled, but there is no definitive facial fracture.   Please do not blow your nose.   Take the following medicines to help with your discomfort.   - Naproxen is a nonsteroidal anti-inflammatory medication that will help with pain and swelling. Be sure to take this medication as prescribed with food, 1 pill every 12 hours,  It should be taken with food, as it can cause stomach upset, and more seriously, stomach bleeding. Do not take other nonsteroidal anti-inflammatory medications with this such as Advil, Motrin, Aleve, Mobic, Goodie Powder, or Motrin.    - Robaxin is the muscle relaxer I have prescribed, this is meant to help with muscle tightness. Be aware that this medication may make you drowsy therefore the first time you take this it should be at a time you are in an environment where you can rest. Do not drive or operate heavy machinery when taking this medication. Do not drink alcohol or take other sedating medications with this medicine such as narcotics or benzodiazepines.   You make take Tylenol per over the counter dosing with these medications.   We have prescribed you new medication(s) today. Discuss the medications prescribed today with your pharmacist as they can have adverse effects and interactions with your other medicines including over the counter and prescribed medications. Seek medical evaluation if you start to experience new or abnormal symptoms after taking one of these medicines, seek care immediately if you start to experience difficulty breathing, feeling of your throat closing, facial swelling, or rash as these could be indications of a more serious allergic reaction   Please follow up with ENT and primary care.  Return to the ER for new or worsening symptoms including but not limited to new or worsening pain,  trouble breathing, passing out, seizure activity, vomiting, nose bleed, or any other concerns.

## 2020-10-11 ENCOUNTER — Ambulatory Visit (HOSPITAL_COMMUNITY)
Admission: EM | Admit: 2020-10-11 | Discharge: 2020-10-11 | Disposition: A | Discharge status: Home / Self Care | Payer: Medicaid Other | Attending: Student | Admitting: Student

## 2020-10-11 ENCOUNTER — Other Ambulatory Visit: Payer: Self-pay

## 2020-10-11 ENCOUNTER — Encounter (HOSPITAL_COMMUNITY): Payer: Self-pay

## 2020-10-11 DIAGNOSIS — J020 Streptococcal pharyngitis: Secondary | ICD-10-CM | POA: Diagnosis present

## 2020-10-11 DIAGNOSIS — J301 Allergic rhinitis due to pollen: Secondary | ICD-10-CM | POA: Insufficient documentation

## 2020-10-11 DIAGNOSIS — Z112 Encounter for screening for other bacterial diseases: Secondary | ICD-10-CM | POA: Diagnosis not present

## 2020-10-11 LAB — POCT RAPID STREP A, ED / UC: Streptococcus, Group A Screen (Direct): NEGATIVE

## 2020-10-11 MED ORDER — LIDOCAINE VISCOUS HCL 2 % MT SOLN: 15.0000 mL | OROMUCOSAL | 0 refills | Status: DC | PRN

## 2020-10-11 MED ORDER — AMOXICILLIN 500 MG PO TABS: 500.0000 mg | ORAL_TABLET | Freq: Two times a day (BID) | ORAL | 0 refills | Status: AC

## 2020-10-11 MED ORDER — PREDNISONE 20 MG PO TABS: 20.0000 mg | ORAL_TABLET | Freq: Every day | ORAL | 0 refills | Status: AC

## 2020-10-11 NOTE — Discharge Instructions (Addendum)
-  Start the antibiotic-Amoxicillin 1 pill every 12 hours for 10 days.  You can take this with food like with breakfast and dinner. -For sore throat, use lidocaine mouthwash up to every 4 hours. Make sure not to eat for at least 1 hour after using this, as your mouth will be very numb and you could bite yourself. -Prednisone, 1 pill taken with breakfast x5 days. This will help with the inflammation.  Try taking this earlier in the day as it can give you energy. Avoid NSAIDs like ibuprofen and alleve while taking this medication as they can increase your risk of stomach upset and even GI bleeding when in combination with a steroid. You can continue tylenol (acetaminophen) up to 1000mg  3x daily. -Seek additional medical attention if new symptoms like shortness of breath, dizziness, weakness. -You'll still be contagious for 24 hours after starting the antibiotic -Throw away your toothbrush after 24 hours

## 2020-10-11 NOTE — ED Triage Notes (Signed)
Pt presents with c/o a sore throat x 4 days.   States she has been taking allergy medicine that has not helped. States she has noticed her throat swelling.

## 2020-10-11 NOTE — ED Provider Notes (Signed)
[facility]    CSN: [id number] Arrival date & time: 10/11/20  0849      History   Chief Complaint Chief Complaint  Patient presents with   Sore Throat    HPI Stacy Mcgee is a 25 y.o. female presenting with sore throat x4 days. Medical history allergic rhinitis, has been taking OTC medication without improvement.  Endorses sore throat for 4 days, worse with swallowing and eating.  Pain on the outside of her neck as well.  States her symptoms did begin after she spent some time outside, since she has been taking Claritin for allergic rhinitis, but no improvement.  States she has had strep before, and is feeling even worse than that.  Feeling well otherwise, denies cough, nausea/vomiting/diarrhea, dizziness, trouble handling her own secretions, etc.  States she did not be pregnant.  HPI  Past Medical History:  Diagnosis Date   Asthma    Eczema    Generalized headaches    Seasonal allergies     Patient Active Problem List   Diagnosis Date Noted   Migraine without aura and without status migrainosus, not intractable 03/04/2015   Tension headache 03/04/2015   Sleeping difficulty 03/04/2015    History reviewed. No pertinent surgical history.  OB History   No obstetric history on file.      Home Medications    Prior to Admission medications   Medication Sig Start Date End Date Taking? Authorizing Provider  amoxicillin (AMOXIL) 500 MG tablet Take 1 tablet (500 mg total) by mouth 2 (two) times daily for 10 days. 10/11/20 10/21/20 Yes Rhys MartiniGraham, Tamberlyn Midgley E, PA-C  lidocaine (XYLOCAINE) 2 % solution Use as directed 15 mLs in the mouth or throat as needed for mouth pain. 10/11/20  Yes Rhys MartiniGraham, Namiyah Grantham E, PA-C  metroNIDAZOLE (FLAGYL) 500 MG tablet Take 1 tablet (500 mg total) by mouth 2 (two) times daily. 07/04/20   Lamptey, Britta MccreedyPhilip O, MD  predniSONE (DELTASONE) 20 MG tablet Take 1 tablet (20 mg total) by mouth daily for 5 days. 10/11/20 10/16/20 Yes Rhys MartiniGraham, Glendoris Nodarse E, PA-C   fluconazole (DIFLUCAN) 150 MG tablet Take 1 tablet (150 mg total) by mouth daily. 07/01/20   Rushie Chestnutovington, Sarah M, PA-C  hydrOXYzine (ATARAX/VISTARIL) 25 MG tablet Take 0.5-1 tablets (12.5-25 mg total) by mouth every 8 (eight) hours as needed for itching. 05/14/20   Wallis BambergMani, Mario, PA-C  lidocaine (LIDODERM) 5 % Place 1 patch onto the skin daily. Remove & Discard patch within 12 hours or as directed by MD 03/17/20   Joy, Shawn C, PA-C  methocarbamol (ROBAXIN) 500 MG tablet Take 1 tablet (500 mg total) by mouth every 8 (eight) hours as needed for muscle spasms. 08/10/20   Petrucelli, Samantha R, PA-C  naproxen (NAPROSYN) 500 MG tablet Take 1 tablet (500 mg total) by mouth 2 (two) times daily as needed for moderate pain. 08/10/20   Petrucelli, Samantha R, PA-C  albuterol (PROVENTIL HFA;VENTOLIN HFA) 108 (90 Base) MCG/ACT inhaler Inhale 1-2 puffs into the lungs every 6 (six) hours as needed for wheezing or shortness of breath. 05/20/18 06/12/19  Orland MustardWolfe, Allison, MD  SUMAtriptan (IMITREX) 50 MG tablet Take 1 tablet (50 mg total) by mouth every 2 (two) hours as needed for migraine. May repeat in 2 hours if headache persists or recurs. Patient not taking: Reported on 03/17/2020 05/09/19 08/10/20  Gailen ShelterFondaw, Wylder S, PA    Family History Family History  Problem Relation Age of Onset   Bipolar disorder Mother    Depression Mother  Migraines Father    ADD / ADHD Sister    Migraines Maternal Aunt    Cancer Maternal Grandfather    Heart Problems Paternal Grandfather     Social History Social History   Tobacco Use   Smoking status: Every Day    Types: Cigarettes   Smokeless tobacco: Never   Tobacco comments:    4 cigarettes per day  Vaping Use   Vaping Use: Never used  Substance Use Topics   Alcohol use: Yes    Comment: socailly   Drug use: No     Allergies   Peanut-containing drug products   Review of Systems Review of Systems  Constitutional:  Negative for appetite change, chills and fever.   HENT:  Positive for sore throat. Negative for congestion, ear pain, rhinorrhea, sinus pressure, sinus pain and trouble swallowing.   Eyes:  Negative for redness and visual disturbance.  Respiratory:  Negative for cough, chest tightness, shortness of breath and wheezing.   Cardiovascular:  Negative for chest pain and palpitations.  Gastrointestinal:  Negative for abdominal pain, constipation, diarrhea, nausea and vomiting.  Genitourinary:  Negative for dysuria, frequency and urgency.  Musculoskeletal:  Negative for myalgias.  Neurological:  Negative for dizziness, weakness and headaches.  Psychiatric/Behavioral:  Negative for confusion.   All other systems reviewed and are negative.   Physical Exam Triage Vital Signs ED Triage Vitals  Enc Vitals Group     BP 10/11/20 0942 137/88     Pulse Rate 10/11/20 0942 91     Resp 10/11/20 0942 20     Temp 10/11/20 0942 98.6 F (37 C)     Temp Source 10/11/20 0942 Oral     SpO2 10/11/20 0942 100 %     Weight --      Height --      Head Circumference --      Peak Flow --      Pain Score 10/11/20 0940 0     Pain Loc --      Pain Edu? --      Excl. in GC? --    No data found.  Updated Vital Signs BP 137/88 (BP Location: Right Arm)   Pulse 91   Temp 98.6 F (37 C) (Oral)   Resp 20   LMP 08/18/2020 (Exact Date)   SpO2 100%   Visual Acuity Right Eye Distance:   Left Eye Distance:   Bilateral Distance:    Right Eye Near:   Left Eye Near:    Bilateral Near:     Physical Exam Vitals reviewed.  Constitutional:      General: She is not in acute distress.    Appearance: Normal appearance. She is not ill-appearing.  HENT:     Head: Normocephalic and atraumatic.     Right Ear: Tympanic membrane, ear canal and external ear normal. No tenderness. No middle ear effusion. There is no impacted cerumen. Tympanic membrane is not perforated, erythematous, retracted or bulging.     Left Ear: Tympanic membrane, ear canal and external ear  normal. No tenderness.  No middle ear effusion. There is no impacted cerumen. Tympanic membrane is not perforated, erythematous, retracted or bulging.     Nose: Nose normal. No congestion.     Mouth/Throat:     Mouth: Mucous membranes are moist.     Pharynx: Uvula midline. Posterior oropharyngeal erythema present. No oropharyngeal exudate.     Tonsils: Tonsillar exudate present. 2+ on the right. 2+ on the left.  Comments: Smooth erythema posterior pharynx. On exam, uvula is midline, she is tolerating her secretions without difficulty, there is no trismus, no drooling, she has normal phonation  Eyes:     Extraocular Movements: Extraocular movements intact.     Pupils: Pupils are equal, round, and reactive to light.  Cardiovascular:     Rate and Rhythm: Normal rate and regular rhythm.     Heart sounds: Normal heart sounds.  Pulmonary:     Effort: Pulmonary effort is normal.     Breath sounds: Normal breath sounds.  Abdominal:     Palpations: Abdomen is soft.     Tenderness: There is no abdominal tenderness. There is no guarding or rebound.  Lymphadenopathy:     Cervical: Cervical adenopathy present.     Right cervical: Superficial cervical adenopathy present.     Left cervical: Superficial cervical adenopathy present.  Neurological:     General: No focal deficit present.     Mental Status: She is alert and oriented to person, place, and time.  Psychiatric:        Mood and Affect: Mood normal.        Behavior: Behavior normal.        Thought Content: Thought content normal.        Judgment: Judgment normal.     UC Treatments / Results  Labs (all labs ordered are listed, but only abnormal results are displayed) Labs Reviewed  CULTURE, GROUP A STREP [facility])  POCT RAPID STREP A, ED / UC    EKG   Radiology No results found.  Procedures Procedures (including critical care time)  Medications Ordered in UC Medications - No data to display  Initial Impression /  Assessment and Plan / UC Course  I have reviewed the triage vital signs and the nursing notes.  Pertinent labs & imaging results that were available during my care of the patient were reviewed by me and considered in my medical decision making (see chart for details).     This patient is a very pleasant 25 y.o. year old female presenting with suspected strep pharyngitis. Afebrile, nontachy. States she is not pregnant.   Rapid strep negative, but centor score 3. Culture sent. This patient did take 2 pills amoxicillin she had at home; suspect this could be contributing to negative result.  Discussed risks and benefits for treating today vs waiting for culture results. Pt prefers to treat for strep today with amoxicillin, prednisone, viscous lidocaine.   Continue claritin for allergic rhinitis component.  ED return precautions discussed. Patient verbalizes understanding and agreement.   Final Clinical Impressions(s) / UC Diagnoses   Final diagnoses:  Strep pharyngitis  Screening for streptococcal infection  Seasonal allergic rhinitis due to pollen     Discharge Instructions      -Start the antibiotic-Amoxicillin 1 pill every 12 hours for 10 days.  You can take this with food like with breakfast and dinner. -For sore throat, use lidocaine mouthwash up to every 4 hours. Make sure not to eat for at least 1 hour after using this, as your mouth will be very numb and you could bite yourself. -Prednisone, 1 pill taken with breakfast x5 days. This will help with the inflammation.  Try taking this earlier in the day as it can give you energy. Avoid NSAIDs like ibuprofen and alleve while taking this medication as they can increase your risk of stomach upset and even GI bleeding when in combination with a steroid. You can continue tylenol (acetaminophen)  up to 1000mg  3x daily. -Seek additional medical attention if new symptoms like shortness of breath, dizziness, weakness. -You'll still be  contagious for 24 hours after starting the antibiotic -Throw away your toothbrush after 24 hours       ED Prescriptions     Medication Sig Dispense Auth. Provider   amoxicillin (AMOXIL) 500 MG tablet Take 1 tablet (500 mg total) by mouth 2 (two) times daily for 10 days. 20 tablet E, PA-C   lidocaine (XYLOCAINE) 2 % solution Use as directed 15 mLs in the mouth or throat as needed for mouth pain. 100 mL Ignacia Bayley, PA-C   predniSONE (DELTASONE) 20 MG tablet Take 1 tablet (20 mg total) by mouth daily for 5 days. 5 tablet Rhys Martini, PA-C      PDMP not reviewed this encounter.   Rhys Martini, PA-C 10/11/20 1117

## 2020-10-12 LAB — CULTURE, GROUP A STREP (THRC)

## 2020-11-05 ENCOUNTER — Ambulatory Visit (HOSPITAL_COMMUNITY)
Admission: EM | Admit: 2020-11-05 | Discharge: 2020-11-05 | Disposition: A | Discharge status: Home / Self Care | Payer: Medicaid Other | Attending: Physician Assistant | Admitting: Physician Assistant

## 2020-11-05 ENCOUNTER — Encounter (HOSPITAL_COMMUNITY): Payer: Self-pay

## 2020-11-05 DIAGNOSIS — M25512 Pain in left shoulder: Secondary | ICD-10-CM | POA: Diagnosis not present

## 2020-11-05 DIAGNOSIS — R0789 Other chest pain: Secondary | ICD-10-CM | POA: Diagnosis not present

## 2020-11-05 LAB — POC URINE PREG, ED: Preg Test, Ur: NEGATIVE

## 2020-11-05 MED ORDER — PREDNISONE 20 MG PO TABS: 40.0000 mg | ORAL_TABLET | Freq: Every day | ORAL | 0 refills | Status: AC

## 2020-11-05 MED ORDER — TIZANIDINE HCL 2 MG PO CAPS: 2.0000 mg | ORAL_CAPSULE | Freq: Every evening | ORAL | 0 refills | Status: DC | PRN

## 2020-11-05 NOTE — ED Triage Notes (Signed)
Pt reports pain under left breast x 3 weeks. Stacy Mcgee is worse when sitting.

## 2020-11-05 NOTE — Discharge Instructions (Addendum)
Start prednisone taper to help with your symptoms.  Take 40 mg in the morning for 4 days.  You should not take NSAIDs including aspirin, ibuprofen/Advil, naproxen/Aleve while taking this as it can cause stomach bleeding.  You can use Tylenol for breakthrough pain.  I have also prescribed a muscle relaxer known as tizanidine that you can take at night to help with rest and symptoms.  This make you sleepy do not drive or drink alcohol while taking it.  If you have any worsening symptoms or if anything changes please return for reevaluation.  Follow-up with your primary care provider within a week to ensure symptoms are improving.

## 2020-11-05 NOTE — ED Provider Notes (Signed)
[facility]    CSN: [id number] Arrival date & time: 11/05/20  1015      History   Chief Complaint Chief Complaint  Patient presents with   Breast Pain    HPI MADASON RAULS is a 25 y.o. female.   Patient presents today with a 3-week history of left chest wall pain and left shoulder pain.  Reports symptoms have been intermittent for several weeks and become persistent over the last several days.  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  Currently, pain is rated 6 on a 0-10 pain scale, localized to ribs under left breast/sternum/left shoulder, described as aching/burning, no alleviating factors identified.  She denies any recent illness or additional symptoms including cough, congestion, headache, dizziness, fever, nausea, vomiting.  She denies any family history of breast cancer.  Denies any breast symptoms including tenderness, mass, nipple discharge, inverted nipple, skin changes.  She is having difficulty with daily activities as result of symptoms.  Denies any associated rash.   Past Medical History:  Diagnosis Date   Asthma    Eczema    Generalized headaches    Seasonal allergies     Patient Active Problem List   Diagnosis Date Noted   Migraine without aura and without status migrainosus, not intractable 03/04/2015   Tension headache 03/04/2015   Sleeping difficulty 03/04/2015    History reviewed. No pertinent surgical history.  OB History   No obstetric history on file.      Home Medications    Prior to Admission medications   Medication Sig Start Date End Date Taking? Authorizing Provider  predniSONE (DELTASONE) 20 MG tablet Take 2 tablets (40 mg total) by mouth daily for 4 days. 11/05/20 11/09/20 Yes Jolee Critcher, Denny Peon K, PA-C  tizanidine (ZANAFLEX) 2 MG capsule Take 1 capsule (2 mg total) by mouth at bedtime  as needed for muscle spasms. 11/05/20  Yes Erendira Crabtree K, PA-C  albuterol (PROVENTIL HFA;VENTOLIN HFA) 108 (90 Base) MCG/ACT inhaler Inhale 1-2 puffs into the lungs every 6 (six) hours as needed for wheezing or shortness of breath. 05/20/18 06/12/19  Orland Mustard, MD  SUMAtriptan (IMITREX) 50 MG tablet Take 1 tablet (50 mg total) by mouth every 2 (two) hours as needed for migraine. May repeat in 2 hours if headache persists or recurs. Patient not taking: Reported on 03/17/2020 05/09/19 08/10/20  Gailen Shelter, PA    Family History Family History  Problem Relation Age of Onset   Bipolar disorder Mother    Depression Mother    Migraines Father    ADD / ADHD Sister    Migraines Maternal Aunt    Cancer Maternal Grandfather    Heart Problems Paternal Grandfather     Social History Social History   Tobacco Use   Smoking status: Every Day    Types: Cigarettes   Smokeless tobacco: Never   Tobacco comments:    4 cigarettes per day  Vaping Use   Vaping Use: Never used  Substance Use Topics   Alcohol use: Yes    Comment: socailly   Drug use: No     Allergies   Peanut-containing drug products   Review of Systems Review of Systems  Constitutional:  Positive for activity change. Negative for appetite change, fatigue and fever.  Respiratory:  Negative for cough and shortness of breath.   Cardiovascular:  Positive for chest pain (chest wall).  Gastrointestinal:  Negative for abdominal pain, diarrhea, nausea and vomiting.  Musculoskeletal:  Positive for arthralgias. Negative for myalgias.  Neurological:  Negative for dizziness, light-headedness and headaches.    Physical Exam Triage Vital Signs ED Triage Vitals  Enc Vitals Group     BP 11/05/20 1129 113/75     Pulse Rate 11/05/20 1129 64     Resp 11/05/20 1129 18     Temp 11/05/20 1129 98.1 F (36.7 C)     Temp Source 11/05/20 1129 Oral     SpO2 11/05/20 1129 99 %     Weight --      Height --      Head Circumference --       Peak Flow --      Pain Score 11/05/20 1128 6     Pain Loc --      Pain Edu? --      Excl. in GC? --    No data found.  Updated Vital Signs BP 113/75 (BP Location: Left Arm)   Pulse 64   Temp 98.1 F (36.7 C) (Oral)   Resp 18   SpO2 99%   Visual Acuity Right Eye Distance:   Left Eye Distance:   Bilateral Distance:    Right Eye Near:   Left Eye Near:    Bilateral Near:     Physical Exam Vitals reviewed.  Constitutional:      General: She is awake. She is not in acute distress.    Appearance: Normal appearance. She is normal weight. She is not ill-appearing.     Comments: Very pleasant female appears stated age in no acute distress sitting comfortably in exam room  HENT:     Head: Normocephalic and atraumatic.  Cardiovascular:     Rate and Rhythm: Normal rate and regular rhythm.     Heart sounds: Normal heart sounds, S1 normal and S2 normal. No murmur heard. Pulmonary:     Effort: Pulmonary effort is normal.     Breath sounds: Normal breath sounds. No wheezing, rhonchi or rales.     Comments: Clear to auscultation bilaterally Chest:     Chest wall: Tenderness present. No deformity or swelling.  Breasts:    Right: Normal.     Left: Normal. No inverted nipple, mass, nipple discharge, skin change or tenderness.     Comments: Pain is reproducible on exam.  Tender to palpation over sternum and left inferior rib cage.  Normal breast exam. Abdominal:     Palpations: Abdomen is soft.     Tenderness: There is no abdominal tenderness.  Lymphadenopathy:     Upper Body:     Right upper body: No axillary adenopathy.     Left upper body: No axillary adenopathy.  Psychiatric:        Behavior: Behavior is cooperative.     UC Treatments / Results  Labs (all labs ordered are listed, but only abnormal results are displayed) Labs Reviewed  POC URINE PREG, ED    EKG   Radiology No results found.  Procedures Procedures (including critical care time)  Medications  Ordered in UC Medications - No data to display  Initial Impression / Assessment and Plan / UC Course  I have reviewed the triage vital signs and the nursing notes.  Pertinent labs & imaging results that were available during my care of the patient were reviewed by me and considered in my medical decision making (see chart for details).  Symptoms consistent with musculoskeletal etiology given pain is reproducible on exam.  Patient denies any involvement of breast and breast exam is normal today so we will defer breast imaging.  Patient was started on prednisone burst (40 mg for 4 days) to help manage pain and inflammation with instruction not to take NSAIDs with this medication due to risk of GI bleeding.  She was prescribed low-dose muscle relaxer (tizanidine 2 mg) to be taken at night to help with symptom management.  She can use Tylenol for breakthrough pain.  Recommend she avoid strenuous physical activity until symptoms improve.  Recommended she follow-up with primary care provider within 1 week to ensure symptom improvement.  Discussed alarm symptoms that warrant emergent evaluation.  Strict return precautions given to which patient expressed understanding.  Final Clinical Impressions(s) / UC Diagnoses   Final diagnoses:  Chest wall pain  Acute pain of left shoulder     Discharge Instructions      Start prednisone taper to help with your symptoms.  Take 40 mg in the morning for 4 days.  You should not take NSAIDs including aspirin, ibuprofen/Advil, naproxen/Aleve while taking this as it can cause stomach bleeding.  You can use Tylenol for breakthrough pain.  I have also prescribed a muscle relaxer known as tizanidine that you can take at night to help with rest and symptoms.  This make you sleepy do not drive or drink alcohol while taking it.  If you have any worsening symptoms or if anything changes please return for reevaluation.  Follow-up with your primary care provider within a  week to ensure symptoms are improving.     ED Prescriptions     Medication Sig Dispense Auth. Provider   predniSONE (DELTASONE) 20 MG tablet Take 2 tablets (40 mg total) by mouth daily for 4 days. 8 tablet Liah Morr K, PA-C   tizanidine (ZANAFLEX) 2 MG capsule Take 1 capsule (2 mg total) by mouth at bedtime as needed for muscle spasms. 10 capsule Charde Macfarlane, Noberto Retort, PA-C      PDMP not reviewed this encounter.   Jeani Hawking, PA-C 11/05/20 1242

## 2021-03-02 ENCOUNTER — Other Ambulatory Visit: Payer: Self-pay | Admitting: Family Medicine

## 2021-03-03 ENCOUNTER — Other Ambulatory Visit: Payer: Self-pay | Admitting: Family Medicine

## 2021-03-03 DIAGNOSIS — R109 Unspecified abdominal pain: Secondary | ICD-10-CM

## 2021-03-12 ENCOUNTER — Encounter (HOSPITAL_COMMUNITY): Payer: Self-pay

## 2021-03-12 ENCOUNTER — Other Ambulatory Visit: Payer: Self-pay

## 2021-03-12 ENCOUNTER — Ambulatory Visit (HOSPITAL_COMMUNITY)
Admission: EM | Admit: 2021-03-12 | Discharge: 2021-03-12 | Disposition: A | Discharge status: Home / Self Care | Payer: Medicaid Other | Attending: Family Medicine | Admitting: Family Medicine

## 2021-03-12 DIAGNOSIS — R42 Dizziness and giddiness: Secondary | ICD-10-CM | POA: Diagnosis not present

## 2021-03-12 DIAGNOSIS — N939 Abnormal uterine and vaginal bleeding, unspecified: Secondary | ICD-10-CM | POA: Diagnosis present

## 2021-03-12 LAB — POCT URINALYSIS DIPSTICK, ED / UC
Bilirubin Urine: NEGATIVE
Glucose, UA: NEGATIVE mg/dL
Leukocytes,Ua: NEGATIVE
Nitrite: NEGATIVE
Protein, ur: 100 mg/dL — AB
Specific Gravity, Urine: >=1.03 (ref 1.005–1.030)
Urobilinogen, UA: 1 mg/dL (ref 0.0–1.0)
pH: 7 (ref 5.0–8.0)

## 2021-03-12 LAB — CBC
HCT: 41.8 % (ref 36.0–46.0)
Hemoglobin: 13.3 g/dL (ref 12.0–15.0)
MCH: 28 pg (ref 26.0–34.0)
MCHC: 31.8 g/dL (ref 30.0–36.0)
MCV: 88 fL (ref 80.0–100.0)
Platelets: 416 10*3/uL — ABNORMAL HIGH (ref 150–400)
RBC: 4.75 MIL/uL (ref 3.87–5.11)
RDW: 13.4 % (ref 11.5–15.5)
WBC: 11 10*3/uL — ABNORMAL HIGH (ref 4.0–10.5)
nRBC: 0 % (ref 0.0–0.2)

## 2021-03-12 LAB — TSH: TSH: 0.711 u[IU]/mL (ref 0.350–4.500)

## 2021-03-12 LAB — POC URINE PREG, ED: Preg Test, Ur: NEGATIVE

## 2021-03-12 NOTE — ED Provider Notes (Addendum)
[facility]    CSN: [id number] Arrival date & time: 03/12/21  1728      History   Chief Complaint Chief Complaint  Patient presents with   Vaginal Bleeding    HPI Stacy Mcgee is a 25 y.o. female.    Vaginal Bleeding Here for 4 months of vaginal bleeding. Has been heavy and sometimes gets dizzy. Today has had some pain in her LLQ. Not on any contraception. She has seen her PCP, and will f/u with them in early January. Was to have [place] when returned. No dysuria. No fever. Not constipated  No syncope.  Past Medical History:  Diagnosis Date   Asthma    Eczema    Generalized headaches    Seasonal allergies     Patient Active Problem List   Diagnosis Date Noted   Migraine without aura and without status migrainosus, not intractable 03/04/2015   Tension headache 03/04/2015   Sleeping difficulty 03/04/2015    History reviewed. No pertinent surgical history.  OB History   No obstetric history on file.      Home Medications    Prior to Admission medications   Medication Sig Start Date End Date Taking? Authorizing Provider  albuterol (PROVENTIL HFA;VENTOLIN HFA) 108 (90 Base) MCG/ACT inhaler Inhale 1-2 puffs into the lungs every 6 (six) hours as needed for wheezing or shortness of breath. 05/20/18 06/12/19  Orland Mustard, MD  SUMAtriptan (IMITREX) 50 MG tablet Take 1 tablet (50 mg total) by mouth every 2 (two) hours as needed for migraine. May repeat in 2 hours if headache persists or recurs. Patient not taking: Reported on 03/17/2020 05/09/19 08/10/20  Gailen Shelter, PA    Family History Family History  Problem Relation Age of Onset   Bipolar disorder Mother    Depression Mother    Migraines Father    ADD / ADHD Sister    Migraines Maternal Aunt    Cancer Maternal Grandfather    Heart Problems Paternal Grandfather     Social History Social History   Tobacco Use   Smoking status: Every Day    Types: Cigarettes   Smokeless tobacco:  Never   Tobacco comments:    4 cigarettes per day  Vaping Use   Vaping Use: Never used  Substance Use Topics   Alcohol use: Yes    Comment: socailly   Drug use: No     Allergies   Peanut-containing drug products   Review of Systems Review of Systems  Genitourinary:  Positive for vaginal bleeding.    Physical Exam Triage Vital Signs ED Triage Vitals  Enc Vitals Group     BP 03/12/21 1815 109/77     Pulse Rate 03/12/21 1814 74     Resp 03/12/21 1814 18     Temp 03/12/21 1814 98.6 F (37 C)     Temp Source 03/12/21 1814 Oral     SpO2 03/12/21 1814 100 %     Weight --      Height --      Head Circumference --      Peak Flow --      Pain Score 03/12/21 1813 7     Pain Loc --      Pain Edu? --      Excl. in GC? --    No data found.  Updated Vital Signs BP 109/77    Pulse 74    Temp 98.6 F (37 C) (Oral)    Resp 18  LMP  (LMP Unknown)    SpO2 100%   Visual Acuity Right Eye Distance:   Left Eye Distance:   Bilateral Distance:    Right Eye Near:   Left Eye Near:    Bilateral Near:     Physical Exam Vitals reviewed.  Constitutional:      General: She is not in acute distress.    Appearance: She is not toxic-appearing.  HENT:     Mouth/Throat:     Mouth: Mucous membranes are moist.     Pharynx: No oropharyngeal exudate.  Eyes:     Extraocular Movements: Extraocular movements intact.  Cardiovascular:     Rate and Rhythm: Normal rate and regular rhythm.     Heart sounds: No murmur heard. Pulmonary:     Breath sounds: Normal breath sounds.  Abdominal:     Palpations: Abdomen is soft. There is no mass.     Tenderness: There is abdominal tenderness (mild left side). There is no guarding.  Musculoskeletal:     Cervical back: Neck supple.  Lymphadenopathy:     Cervical: No cervical adenopathy.  Skin:    Coloration: Skin is not jaundiced or pale.  Neurological:     General: No focal deficit present.     Mental Status: She is alert.  Psychiatric:         Behavior: Behavior normal.     UC Treatments / Results  Labs (all labs ordered are listed, but only abnormal results are displayed) Labs Reviewed  POCT URINALYSIS DIPSTICK, ED / UC - Abnormal; Notable for the following components:      Result Value   Ketones, ur TRACE (*)    Hgb urine dipstick LARGE (*)    Protein, ur 100 (*)    All other components within normal limits  URINE CULTURE  CBC  TSH  POC URINE PREG, ED    EKG   Radiology No results found.  Procedures Procedures (including critical care time)  Medications Ordered in UC Medications - No data to display  Initial Impression / Assessment and Plan / UC Course  I have reviewed the triage vital signs and the nursing notes.  Pertinent labs & imaging results that were available during my care of the patient were reviewed by me and considered in my medical decision making (see chart for details).     UPT negative. UA shows blood and ketones. Discussed she does need [place] eventually, and to call her PCP. CBC today to assess for anemia, and TSH to make sure not thyroid related (they had tested her for ?numbness in extremities)  Final Clinical Impressions(s) / UC Diagnoses   Final diagnoses:  Abnormal uterine bleeding (AUB)  Dizziness and giddiness     Discharge Instructions      Take a multivitamin with iron for now. Staff will call you with results if abnormal on the lab. Also check your mychart.  Call your primary doctor about the dizziness and prolonged bleeding. If persistently dizzy or that pain is worsening, go to the ER.     ED Prescriptions   None    PDMP not reviewed this encounter.   Zenia Resides, MD 03/12/21 Vinnie Langton    Zenia Resides, MD 03/12/21 [phone]

## 2021-03-12 NOTE — Discharge Instructions (Addendum)
Take a multivitamin with iron for now. Staff will call you with results if abnormal on the lab. Also check your mychart.  Call your primary doctor about the dizziness and prolonged bleeding. If persistently dizzy or that pain is worsening, go to the ER.

## 2021-03-12 NOTE — ED Triage Notes (Signed)
Pt presents with vaginal bleeding x 4 months.   Pt states she has pain during intercourse and states she has sharp pain on the LLQ and lower back.

## 2021-03-13 LAB — URINE CULTURE

## 2021-04-11 ENCOUNTER — Encounter: Payer: Medicaid Other | Admitting: Obstetrics

## 2021-05-25 ENCOUNTER — Other Ambulatory Visit: Payer: Self-pay

## 2021-05-25 ENCOUNTER — Encounter (HOSPITAL_COMMUNITY): Payer: Self-pay

## 2021-05-25 ENCOUNTER — Ambulatory Visit (HOSPITAL_COMMUNITY)
Admission: EM | Admit: 2021-05-25 | Discharge: 2021-05-25 | Disposition: A | Discharge status: Home / Self Care | Payer: Medicaid Other | Attending: Family Medicine | Admitting: Family Medicine

## 2021-05-25 DIAGNOSIS — L03213 Periorbital cellulitis: Secondary | ICD-10-CM | POA: Diagnosis not present

## 2021-05-25 MED ORDER — IBUPROFEN 800 MG PO TABS: 800.0000 mg | ORAL_TABLET | Freq: Three times a day (TID) | ORAL | 0 refills | Status: DC | PRN

## 2021-05-25 MED ORDER — AMOXICILLIN-POT CLAVULANATE 875-125 MG PO TABS: 1.0000 | ORAL_TABLET | Freq: Two times a day (BID) | ORAL | 0 refills | Status: AC

## 2021-05-25 NOTE — ED Triage Notes (Signed)
Pt presents with left eye swelling and eczema flare-up.  ?

## 2021-05-25 NOTE — Discharge Instructions (Addendum)
Take amoxicillin-clavulanate 875 mg, 1 tablet twice daily with food for 7 days ? ?B Profen 800 mg 1 every 8 hours as needed for pain. ? ?Cool compresses to the sore area. ? ? ?

## 2021-05-25 NOTE — ED Provider Notes (Signed)
?[facility] ? ? ? ?CSN: [id number] ?Arrival date & time: 05/25/21  6568 ? ? ?  ? ?History   ?Chief Complaint ?Chief Complaint  ?Patient presents with  ? Facial Swelling  ? Eczema  ? ? ?HPI ?Stacy Mcgee is a 26 y.o. female.  ? ?HPI ?Here for 3 to 4-day history of swelling superior to to her left eye and onto the left eyelid. ?For about 3 weeks she has had a flare of her eczema, and is using cortisone 10.  She states that often when her eczema flares, then she has bumps that start.  No fever or chills.  No vomiting or diarrhea. ? ?She does have a PCP, but she has not established with a dermatologist in the last few years ? ?Past Medical History:  ?Diagnosis Date  ? Asthma   ? Eczema   ? Generalized headaches   ? Seasonal allergies   ? ? ?Patient Active Problem List  ? Diagnosis Date Noted  ? Migraine without aura and without status migrainosus, not intractable 03/04/2015  ? Tension headache 03/04/2015  ? Sleeping difficulty 03/04/2015  ? ? ?History reviewed. No pertinent surgical history. ? ?OB History   ?No obstetric history on file. ?  ? ? ? ?Home Medications   ? ?Prior to Admission medications   ?Medication Sig Start Date End Date Taking? Authorizing Provider  ?amoxicillin-clavulanate (AUGMENTIN) 875-125 MG tablet Take 1 tablet by mouth 2 (two) times daily for 7 days. 05/25/21 06/01/21 Yes Zenia Resides, MD  ?ibuprofen (ADVIL) 800 MG tablet Take 1 tablet (800 mg total) by mouth every 8 (eight) hours as needed (pain). 05/25/21  Yes Zenia Resides, MD  ?albuterol (PROVENTIL HFA;VENTOLIN HFA) 108 (90 Base) MCG/ACT inhaler Inhale 1-2 puffs into the lungs every 6 (six) hours as needed for wheezing or shortness of breath. 05/20/18 06/12/19  Orland Mustard, MD  ?SUMAtriptan (IMITREX) 50 MG tablet Take 1 tablet (50 mg total) by mouth every 2 (two) hours as needed for migraine. May repeat in 2 hours if headache persists or recurs. ?Patient not taking: Reported on 03/17/2020 05/09/19 08/10/20  Gailen Shelter, PA  ? ? ?Family History ?Family History  ?Problem Relation Age of Onset  ? Bipolar disorder Mother   ? Depression Mother   ? Migraines Father   ? ADD / ADHD Sister   ? Migraines Maternal Aunt   ? Cancer Maternal Grandfather   ? Heart Problems Paternal Grandfather   ? ? ?Social History ?Social History  ? ?Tobacco Use  ? Smoking status: Every Day  ?  Types: Cigarettes  ? Smokeless tobacco: Never  ? Tobacco comments:  ?  4 cigarettes per day  ?Vaping Use  ? Vaping Use: Never used  ?Substance Use Topics  ? Alcohol use: Yes  ?  Comment: socailly  ? Drug use: No  ? ? ? ?Allergies   ?Peanut-containing drug products ? ? ?Review of Systems ?Review of Systems ? ? ?Physical Exam ?Triage Vital Signs ?ED Triage Vitals [05/25/21 0939]  ?Enc Vitals Group  ?   BP (!) 141/82  ?   Pulse Rate 79  ?   Resp 16  ?   Temp 98.7 ?F (37.1 ?C)  ?   Temp Source Oral  ?   SpO2   ?   Weight   ?   Height   ?   Head Circumference   ?   Peak Flow   ?   Pain Score   ?  Pain Loc   ?   Pain Edu?   ?   Excl. in GC?   ? ?No data found. ? ?Updated Vital Signs ?BP (!) 141/82 (BP Location: Left Arm)   Pulse 79   Temp 98.7 ?F (37.1 ?C) (Oral)   Resp 16   LMP 05/20/2021  ? ?Visual Acuity ?Right Eye Distance:   ?Left Eye Distance:   ?Bilateral Distance:   ? ?Right Eye Near:   ?Left Eye Near:    ?Bilateral Near:    ? ?Physical Exam ?Vitals reviewed.  ?Constitutional:   ?   General: She is not in acute distress. ?   Appearance: She is not toxic-appearing.  ?HENT:  ?   Mouth/Throat:  ?   Mouth: Mucous membranes are moist.  ?Eyes:  ?   Extraocular Movements: Extraocular movements intact.  ?   Conjunctiva/sclera: Conjunctivae normal.  ?   Pupils: Pupils are equal, round, and reactive to light.  ?   Comments: She has some edema over her left eyebrow extending onto her left upper eyelid, making it difficult for her to open her left eye completely.  There is also some mild erythema there.  And medial end of her eyebrow there is a 2 to 3 mm slightly raised  erythematous bump.  There is no fluctuance.  No eye discharge  ?Cardiovascular:  ?   Rate and Rhythm: Normal rate and regular rhythm.  ?Skin: ?   Coloration: Skin is not pale.  ?Neurological:  ?   Mental Status: She is alert and oriented to person, place, and time.  ?Psychiatric:     ?   Behavior: Behavior normal.  ? ? ? ?UC Treatments / Results  ?Labs ?(all labs ordered are listed, but only abnormal results are displayed) ?Labs Reviewed - No data to display ? ?EKG ? ? ?Radiology ?No results found. ? ?Procedures ?Procedures (including critical care time) ? ?Medications Ordered in UC ?Medications - No data to display ? ?Initial Impression / Assessment and Plan / UC Course  ?I have reviewed the triage vital signs and the nursing notes. ? ?Pertinent labs & imaging results that were available during my care of the patient were reviewed by me and considered in my medical decision making (see chart for details). ? ?  ? ?Treat with oral antibiotics for possible secondary skin infection.  We discussed her getting in with her primary care office to possibly get referred to dermatology ?Final Clinical Impressions(s) / UC Diagnoses  ? ?Final diagnoses:  ?Periorbital cellulitis of left eye  ? ? ? ?Discharge Instructions   ? ?  ?Take amoxicillin-clavulanate 875 mg, 1 tablet twice daily with food for 7 days ? ?B Profen 800 mg 1 every 8 hours as needed for pain. ? ?Cool compresses to the sore area. ? ? ? ? ? ? ?ED Prescriptions   ? ? Medication Sig Dispense Auth. Provider  ? amoxicillin-clavulanate (AUGMENTIN) 875-125 MG tablet Take 1 tablet by mouth 2 (two) times daily for 7 days. 14 tablet Deanza Upperman, Janace Aris, MD  ? ibuprofen (ADVIL) 800 MG tablet Take 1 tablet (800 mg total) by mouth every 8 (eight) hours as needed (pain). 21 tablet Marlinda Mike Janace Aris, MD  ? ?  ? ?PDMP not reviewed this encounter. ?  ?Zenia Resides, MD ?05/25/21 [phone] ? ?

## 2021-06-14 ENCOUNTER — Telehealth (HOSPITAL_COMMUNITY): Payer: Self-pay | Admitting: Family Medicine

## 2021-06-14 NOTE — Telephone Encounter (Signed)
Pt called stating Augmentin was not sent to pharmacy.  ? ?I asked to call the patient back, so that I can speak with a medical provider. Pt expressed understanding.  ? ? ?

## 2021-07-16 ENCOUNTER — Ambulatory Visit
Admission: EM | Admit: 2021-07-16 | Discharge: 2021-07-16 | Disposition: A | Discharge status: Home / Self Care | Payer: Medicaid Other | Attending: Urgent Care | Admitting: Urgent Care

## 2021-07-16 ENCOUNTER — Other Ambulatory Visit: Payer: Self-pay

## 2021-07-16 ENCOUNTER — Encounter: Payer: Self-pay | Admitting: Emergency Medicine

## 2021-07-16 DIAGNOSIS — R102 Pelvic and perineal pain: Secondary | ICD-10-CM | POA: Diagnosis not present

## 2021-07-16 LAB — POCT URINE PREGNANCY: Preg Test, Ur: NEGATIVE

## 2021-07-16 LAB — POCT URINALYSIS DIP (MANUAL ENTRY)
Bilirubin, UA: NEGATIVE
Blood, UA: NEGATIVE
Glucose, UA: NEGATIVE mg/dL
Ketones, POC UA: NEGATIVE mg/dL
Leukocytes, UA: NEGATIVE
Nitrite, UA: NEGATIVE
Protein Ur, POC: NEGATIVE mg/dL
Spec Grav, UA: 1.02 (ref 1.010–1.025)
Urobilinogen, UA: 1 E.U./dL
pH, UA: 7 (ref 5.0–8.0)

## 2021-07-16 NOTE — ED Provider Notes (Signed)
?[facility] ? ? ? ?CSN: [id number] ?Arrival date & time: 07/16/21  1051 ? ? ?  ? ?History   ?Chief Complaint ?Chief Complaint  ?Patient presents with  ? Possible UTI  ? ? ?HPI ?Stacy Mcgee is a 26 y.o. female.  ? ?Pleasant 26 year old female presents today with concerns of possible UTI.  She states for the past 2 to 3 weeks she has been having some intermittent pelvic pressure and discomfort.  She states it seems to radiate to her back intermittently.  Just yesterday, she noticed that her urine was cloudy and malodorous.  She denies frequency or dysuria.  She also states that last week she ended her menstrual period, but normally she will bleed for much longer and states that it was only 2 days.  She denies any vaginal discharge or itching.  She does have a gynecology appointment scheduled for Thursday.  Denies fever or hematuria.  No history of ovarian cyst per patient. ? ? ? ?Past Medical History:  ?Diagnosis Date  ? Asthma   ? Eczema   ? Generalized headaches   ? Seasonal allergies   ? ? ?Patient Active Problem List  ? Diagnosis Date Noted  ? Migraine without aura and without status migrainosus, not intractable 03/04/2015  ? Tension headache 03/04/2015  ? Sleeping difficulty 03/04/2015  ? ? ?History reviewed. No pertinent surgical history. ? ?OB History   ?No obstetric history on file. ?  ? ? ? ?Home Medications   ? ?Prior to Admission medications   ?Medication Sig Start Date End Date Taking? Authorizing Provider  ?ibuprofen (ADVIL) 800 MG tablet Take 1 tablet (800 mg total) by mouth every 8 (eight) hours as needed (pain). 05/25/21   Zenia Resides, MD  ?albuterol (PROVENTIL HFA;VENTOLIN HFA) 108 (90 Base) MCG/ACT inhaler Inhale 1-2 puffs into the lungs every 6 (six) hours as needed for wheezing or shortness of breath. 05/20/18 06/12/19  Orland Mustard, MD  ?SUMAtriptan (IMITREX) 50 MG tablet Take 1 tablet (50 mg total) by mouth every 2 (two) hours as needed for migraine. May repeat in 2 hours  if headache persists or recurs. ?Patient not taking: Reported on 03/17/2020 05/09/19 08/10/20  Gailen Shelter, PA  ? ? ?Family History ?Family History  ?Problem Relation Age of Onset  ? Bipolar disorder Mother   ? Depression Mother   ? Migraines Father   ? ADD / ADHD Sister   ? Migraines Maternal Aunt   ? Cancer Maternal Grandfather   ? Heart Problems Paternal Grandfather   ? ? ?Social History ?Social History  ? ?Tobacco Use  ? Smoking status: Every Day  ?  Types: Cigarettes  ? Smokeless tobacco: Never  ? Tobacco comments:  ?  4 cigarettes per day  ?Vaping Use  ? Vaping Use: Never used  ?Substance Use Topics  ? Alcohol use: Yes  ?  Comment: socailly  ? Drug use: No  ? ? ? ?Allergies   ?Peanut-containing drug products ? ? ?Review of Systems ?Review of Systems ?As per hpi ? ?Physical Exam ?Triage Vital Signs ?ED Triage Vitals  ?Enc Vitals Group  ?   BP 07/16/21 1210 116/71  ?   Pulse Rate 07/16/21 1210 79  ?   Resp 07/16/21 1210 18  ?   Temp 07/16/21 1210 (!) 97.5 ?F (36.4 ?C)  ?   Temp Source 07/16/21 1210 Oral  ?   SpO2 07/16/21 1210 100 %  ?   Weight 07/16/21 1212 239 lb (108.4  kg)  ?   Height 07/16/21 1212 5\' 3"  (1.6 m)  ?   Head Circumference --   ?   Peak Flow --   ?   Pain Score 07/16/21 1212 3  ?   Pain Loc --   ?   Pain Edu? --   ?   Excl. in GC? --   ? ?No data found. ? ?Updated Vital Signs ?BP 116/71 (BP Location: Left Arm)   Pulse 79   Temp (!) 97.5 ?F (36.4 ?C) (Oral)   Resp 18   Ht 5\' 3"  (1.6 m)   Wt 239 lb (108.4 kg)   LMP 07/09/2021   SpO2 100%   BMI 42.34 kg/m?  ? ?Visual Acuity ?Right Eye Distance:   ?Left Eye Distance:   ?Bilateral Distance:   ? ?Right Eye Near:   ?Left Eye Near:    ?Bilateral Near:    ? ?Physical Exam ?Vitals and nursing note reviewed. Chaperone present: deferred.  ?Constitutional:   ?   General: She is not in acute distress. ?   Appearance: She is well-developed. She is obese. She is not ill-appearing or toxic-appearing.  ?HENT:  ?   Head: Normocephalic.  ?Cardiovascular:   ?   Rate and Rhythm: Normal rate.  ?   Heart sounds: No murmur heard. ?Pulmonary:  ?   Effort: Pulmonary effort is normal. No respiratory distress.  ?   Breath sounds: No wheezing.  ?Abdominal:  ?   General: Abdomen is flat. Bowel sounds are normal. There is no distension. There are no signs of injury.  ?   Palpations: Abdomen is soft. There is no shifting dullness, fluid wave, hepatomegaly, splenomegaly, mass or pulsatile mass.  ?   Tenderness: There is no abdominal tenderness. There is no right CVA tenderness, left CVA tenderness, guarding or rebound. Negative signs include Murphy's sign, Rovsing's sign, McBurney's sign, psoas sign and obturator sign.  ?   Hernia: No hernia is present.  ?Genitourinary: ?   General: Normal vulva.  ?   Pubic Area: No rash or pubic lice.   ?   Labia:     ?   Right: No rash, tenderness, lesion or injury.     ?   Left: No rash, tenderness, lesion or injury.   ?   Urethra: No prolapse, urethral pain, urethral swelling or urethral lesion.  ?   Vagina: No signs of injury and foreign body. Vaginal discharge (clear, frothy, foul odor) present. No erythema, tenderness, bleeding, lesions or prolapsed vaginal walls.  ?   Cervix: No cervical motion tenderness, discharge, friability, lesion, erythema or eversion.  ?   Uterus: Normal. Not deviated, not enlarged, not fixed, not tender and no uterine prolapse.   ?   Adnexa: Right adnexa normal and left adnexa normal.    ?   Right: No mass, tenderness or fullness.      ?   Left: No mass, tenderness or fullness.    ?   Rectum: Normal.  ?Skin: ?   General: Skin is warm and dry.  ?   Capillary Refill: Capillary refill takes less than 2 seconds.  ?   Coloration: Skin is not jaundiced.  ?   Findings: No erythema or rash.  ?Neurological:  ?   General: No focal deficit present.  ?   Mental Status: She is alert and oriented to person, place, and time.  ? ? ? ?UC Treatments / Results  ?Labs ?(all labs ordered are listed, but only abnormal  results are  displayed) ?Labs Reviewed  ?POCT URINALYSIS DIP (MANUAL ENTRY) - Abnormal; Notable for the following components:  ?    Result Value  ? Clarity, UA cloudy (*)   ? All other components within normal limits  ?URINE CULTURE  ?POCT URINE PREGNANCY  ?CERVICOVAGINAL ANCILLARY ONLY  ? ? ?EKG ? ? ?Radiology ?No results found. ? ?Procedures ?Procedures (including critical care time) ? ?Medications Ordered in UC ?Medications - No data to display ? ?Initial Impression / Assessment and Plan / UC Course  ?I have reviewed the triage vital signs and the nursing notes. ? ?Pertinent labs & imaging results that were available during my care of the patient were reviewed by me and considered in my medical decision making (see chart for details). ? ?  ? ?Suprapubic pain -UA unremarkable, will send out urine culture for confirmation.  Pregnancy test negative.  Pelvic exam performed with clear frothy discharge noted.  Aptima swab obtained, will call with results.  Patient does have a follow-up evaluation with her gynecologist on Thursday.  We will treat any positives from the Aptima, otherwise further work-up with gynecology at this time. ? ?Final Clinical Impressions(s) / UC Diagnoses  ? ?Final diagnoses:  ?Suprapubic pain, acute  ? ? ? ?Discharge Instructions   ? ?  ?You were tested today for gonorrhea, chlamydia, trichomonas, BV, and yeast. ?You were also tested for a urinary tract infection and a pregnancy test. ?Your urine sample looks clean but we will send it out for culture to confirm. ?Your pregnancy test is negative. ?We will call you with the results of your test once received. ?Keep your appointment with your gynecologist on Thursday, if any results are positive we will call you and start treatment. ? ? ? ? ?ED Prescriptions   ?None ?  ? ?PDMP not reviewed this encounter. ?  Maretta Bees?[place] Spees L, [place] ?07/16/21 1251 ? ?

## 2021-07-16 NOTE — Discharge Instructions (Addendum)
You were tested today for gonorrhea, chlamydia, trichomonas, BV, and yeast. ?You were also tested for a urinary tract infection and a pregnancy test. ?Your urine sample looks clean but we will send it out for culture to confirm. ?Your pregnancy test is negative. ?We will call you with the results of your test once received. ?Keep your appointment with your gynecologist on Thursday, if any results are positive we will call you and start treatment. ?

## 2021-07-16 NOTE — ED Triage Notes (Signed)
Patient c/o odorous urine, abdominal pain, low back pain, possible concern for pregnancy.  Requesting pregnancy test, no concern for STI. ?

## 2021-07-17 LAB — CERVICOVAGINAL ANCILLARY ONLY
Bacterial Vaginitis (gardnerella): POSITIVE — AB
Candida Glabrata: NEGATIVE
Candida Vaginitis: NEGATIVE
Chlamydia: NEGATIVE
Comment: NEGATIVE
Comment: NEGATIVE
Comment: NEGATIVE
Comment: NEGATIVE
Comment: NEGATIVE
Comment: NORMAL
Neisseria Gonorrhea: NEGATIVE
Trichomonas: NEGATIVE

## 2021-07-18 ENCOUNTER — Telehealth (HOSPITAL_COMMUNITY): Payer: Self-pay | Admitting: Emergency Medicine

## 2021-07-18 LAB — URINE CULTURE

## 2021-07-18 MED ORDER — METRONIDAZOLE 500 MG PO TABS: 500.0000 mg | ORAL_TABLET | Freq: Two times a day (BID) | ORAL | 0 refills | Status: DC

## 2021-07-19 ENCOUNTER — Encounter: Payer: Medicaid Other | Admitting: Radiology

## 2022-03-08 ENCOUNTER — Ambulatory Visit
Admission: EM | Admit: 2022-03-08 | Discharge: 2022-03-08 | Disposition: A | Discharge status: Home / Self Care | Payer: Medicaid Other | Attending: Internal Medicine | Admitting: Internal Medicine

## 2022-03-08 DIAGNOSIS — R300 Dysuria: Secondary | ICD-10-CM | POA: Diagnosis present

## 2022-03-08 DIAGNOSIS — R1030 Lower abdominal pain, unspecified: Secondary | ICD-10-CM | POA: Diagnosis not present

## 2022-03-08 DIAGNOSIS — N898 Other specified noninflammatory disorders of vagina: Secondary | ICD-10-CM

## 2022-03-08 DIAGNOSIS — B9689 Other specified bacterial agents as the cause of diseases classified elsewhere: Secondary | ICD-10-CM | POA: Diagnosis present

## 2022-03-08 DIAGNOSIS — N760 Acute vaginitis: Secondary | ICD-10-CM | POA: Diagnosis not present

## 2022-03-08 LAB — POCT URINALYSIS DIP (MANUAL ENTRY)
Bilirubin, UA: NEGATIVE
Blood, UA: NEGATIVE
Glucose, UA: NEGATIVE mg/dL
Ketones, POC UA: NEGATIVE mg/dL
Leukocytes, UA: NEGATIVE
Nitrite, UA: NEGATIVE
Protein Ur, POC: NEGATIVE mg/dL
Spec Grav, UA: 1.02 (ref 1.010–1.025)
Urobilinogen, UA: 1 E.U./dL
pH, UA: 7.5 (ref 5.0–8.0)

## 2022-03-08 LAB — POCT URINE PREGNANCY: Preg Test, Ur: NEGATIVE

## 2022-03-08 MED ORDER — NAPROXEN 375 MG PO TABS: 375.0000 mg | ORAL_TABLET | Freq: Two times a day (BID) | ORAL | 0 refills | Status: DC

## 2022-03-08 MED ORDER — METRONIDAZOLE 500 MG PO TABS: 500.0000 mg | ORAL_TABLET | Freq: Two times a day (BID) | ORAL | 0 refills | Status: DC

## 2022-03-08 NOTE — ED Triage Notes (Signed)
Pt c/o lower abd/lower back pain, dysuria and odor x 1 week-denies vaginal d/c-NAD-steady gait

## 2022-03-08 NOTE — ED Provider Notes (Signed)
[facility]  Note:  This document was prepared using Conservation officer, historic buildings and may include unintentional dictation errors.  MRN: [id number] DOB: Feb 21, 1996  Subjective:   Stacy Mcgee is a 26 y.o. female presenting for 1 week history of persistent strong vaginal odor, lower abdominal pains that radiate to the back, intermittent dysuria and urinary frequency. Denies fever, n/v, rashes, vaginal discharge.  LMP was 01/11/2022, was regular in the few months prior to that.  However, she does have a history of irregular cycles.  Earlier this year she had really heavy cycles, painful cramping and ultimately was treated for BV which then regulate her cycle.  This was done in April 2023.  Does not see a gynecologist.  Has previously been told that she may need an ultrasound to rule out fibroids or other gynecologic abnormalities but has not pursued this.  No current facility-administered medications for this encounter.  Current Outpatient Medications:    ibuprofen (ADVIL) 800 MG tablet, Take 1 tablet (800 mg total) by mouth every 8 (eight) hours as needed (pain)., Disp: 21 tablet, Rfl: 0   metroNIDAZOLE (FLAGYL) 500 MG tablet, Take 1 tablet (500 mg total) by mouth 2 (two) times daily., Disp: 14 tablet, Rfl: 0   Allergies  Allergen Reactions   Peanut-Containing Drug Products Anaphylaxis    Past Medical History:  Diagnosis Date   Asthma    Eczema    Generalized headaches    Seasonal allergies      History reviewed. No pertinent surgical history.  Family History  Problem Relation Age of Onset   Bipolar disorder Mother    Depression Mother    Migraines Father    ADD / ADHD Sister    Migraines Maternal Aunt    Cancer Maternal Grandfather    Heart Problems Paternal Grandfather     Social History   Tobacco Use   Smoking status: Every Day    Types: Cigarettes   Smokeless tobacco: Never   Tobacco comments:    4 cigarettes per day  Vaping  Use   Vaping Use: Never used  Substance Use Topics   Alcohol use: Yes    Comment: occ   Drug use: Yes    Types: Marijuana    ROS   Objective:   Vitals: BP (!) 150/82 (BP Location: Right Arm)   Pulse 72   Temp 98.6 F (37 C) (Oral)   Resp 18   LMP 01/11/2022   SpO2 98%   Physical Exam Constitutional:      General: She is not in acute distress.    Appearance: Normal appearance. She is well-developed. She is not ill-appearing, toxic-appearing or diaphoretic.  HENT:     Head: Normocephalic and atraumatic.     Nose: Nose normal.     Mouth/Throat:     Mouth: Mucous membranes are moist.     Pharynx: Oropharynx is clear.  Eyes:     General: No scleral icterus.       Right eye: No discharge.        Left eye: No discharge.     Extraocular Movements: Extraocular movements intact.     Conjunctiva/sclera: Conjunctivae normal.  Cardiovascular:     Rate and Rhythm: Normal rate.  Pulmonary:     Effort: Pulmonary effort is normal.  Abdominal:     General: Bowel sounds are normal. There is no distension.     Palpations: Abdomen is soft. There is no mass.     Tenderness:  There is abdominal tenderness in the periumbilical area, suprapubic area and left lower quadrant. There is no right CVA tenderness, left CVA tenderness, guarding or rebound.  Skin:    General: Skin is warm and dry.  Neurological:     General: No focal deficit present.     Mental Status: She is alert and oriented to person, place, and time.  Psychiatric:        Mood and Affect: Mood normal.        Behavior: Behavior normal.        Thought Content: Thought content normal.        Judgment: Judgment normal.     Results for orders placed or performed during the hospital encounter of 03/08/22 (from the past 24 hour(s))  POCT urine pregnancy     Status: None   Collection Time: 03/08/22  5:06 PM  Result Value Ref Range   Preg Test, Ur Negative Negative  POCT urinalysis dipstick     Status: None   Collection  Time: 03/08/22  5:06 PM  Result Value Ref Range   Color, UA yellow yellow   Clarity, UA clear clear   Glucose, UA negative negative mg/dL   Bilirubin, UA negative negative   Ketones, POC UA negative negative mg/dL   Spec Grav, UA 7.116 5.790 - 1.025   Blood, UA negative negative   pH, UA 7.5 5.0 - 8.0   Protein Ur, POC negative negative mg/dL   Urobilinogen, UA 1.0 0.2 or 1.0 E.U./dL   Nitrite, UA Negative Negative   Leukocytes, UA Negative Negative    Assessment and Plan :   PDMP not reviewed this encounter.  1. Bacterial vaginosis   2. Lower abdominal pain   3. Vaginal odor   4. Dysuria     Recommended room for bacterial vaginosis given her recent history.  Start Flagyl.  Vaginal swab results pending, will treat as appropriate otherwise.  Urine culture pending.  Patient hydrates well and recommended she continue to do so.  Emphasized need to establish care with a gynecologist for further work up. Counseled patient on potential for adverse effects with medications prescribed/recommended today, ER and return-to-clinic precautions discussed, patient verbalized understanding.    Wallis Bamberg, PA-C 03/08/22 1724

## 2022-03-09 LAB — URINE CULTURE: Culture: NO GROWTH

## 2022-03-09 LAB — CERVICOVAGINAL ANCILLARY ONLY
Bacterial Vaginitis (gardnerella): POSITIVE — AB
Candida Glabrata: NEGATIVE
Candida Vaginitis: NEGATIVE
Chlamydia: NEGATIVE
Comment: NEGATIVE
Comment: NEGATIVE
Comment: NEGATIVE
Comment: NEGATIVE
Comment: NEGATIVE
Comment: NORMAL
Neisseria Gonorrhea: NEGATIVE
Trichomonas: POSITIVE — AB

## 2022-10-07 ENCOUNTER — Encounter (HOSPITAL_COMMUNITY): Payer: Self-pay | Admitting: Emergency Medicine

## 2022-10-07 ENCOUNTER — Ambulatory Visit (HOSPITAL_COMMUNITY)
Admission: EM | Admit: 2022-10-07 | Discharge: 2022-10-07 | Disposition: A | Discharge status: Home / Self Care | Payer: Medicaid Other | Attending: Emergency Medicine | Admitting: Emergency Medicine

## 2022-10-07 ENCOUNTER — Other Ambulatory Visit: Payer: Self-pay

## 2022-10-07 DIAGNOSIS — L209 Atopic dermatitis, unspecified: Secondary | ICD-10-CM

## 2022-10-07 DIAGNOSIS — L2084 Intrinsic (allergic) eczema: Secondary | ICD-10-CM

## 2022-10-07 DIAGNOSIS — L309 Dermatitis, unspecified: Secondary | ICD-10-CM | POA: Diagnosis not present

## 2022-10-07 DIAGNOSIS — Z113 Encounter for screening for infections with a predominantly sexual mode of transmission: Secondary | ICD-10-CM

## 2022-10-07 HISTORY — DX: Encounter for screening for infections with a predominantly sexual mode of transmission: Z11.3

## 2022-10-07 MED ORDER — METHYLPREDNISOLONE 4 MG PO TBPK: ORAL_TABLET | ORAL | 0 refills | Status: DC

## 2022-10-07 NOTE — ED Provider Notes (Addendum)
[facility]    CSN: [id number] Arrival date & time: 10/07/22  1250      History   Chief Complaint Chief Complaint  Patient presents with   Rash    HPI Stacy Mcgee is a 27 y.o. female.   27 year old female, Stacy Mcgee, presents to urgent care with chief complaint of worsening eczema and bumps on face.  Patient states she has used triamcinolone and Eucerin, was outside yesterday on go-cart.  Patient also requesting STI testing, no known exposure or symptoms.  The history is provided by the patient. No language interpreter was used.    Past Medical History:  Diagnosis Date   Asthma    Eczema    Generalized headaches    Seasonal allergies     Patient Active Problem List   Diagnosis Date Noted   Eczema 10/07/2022   Screening examination for STI 10/07/2022   Migraine without aura and without status migrainosus, not intractable 03/04/2015   Tension headache 03/04/2015   Sleeping difficulty 03/04/2015    History reviewed. No pertinent surgical history.  OB History   No obstetric history on file.      Home Medications    Prior to Admission medications   Medication Sig Start Date End Date Taking? Authorizing Provider  methylPREDNISolone (MEDROL DOSEPAK) 4 MG TBPK tablet Take as package directed, taper dose 10/07/22  Yes Rylan Kaufmann, Para March, NP  ibuprofen (ADVIL) 800 MG tablet Take 1 tablet (800 mg total) by mouth every 8 (eight) hours as needed (pain). 05/25/21   Zenia Resides, MD  metroNIDAZOLE (FLAGYL) 500 MG tablet Take 1 tablet (500 mg total) by mouth 2 (two) times daily. Patient not taking: Reported on 10/07/2022 03/08/22   Wallis Bamberg, PA-C  naproxen (NAPROSYN) 375 MG tablet Take 1 tablet (375 mg total) by mouth 2 (two) times daily with a meal. Patient not taking: Reported on 10/07/2022 03/08/22   Wallis Bamberg, PA-C  albuterol (PROVENTIL HFA;VENTOLIN HFA) 108 [phone] Base) MCG/ACT inhaler Inhale 1-2 puffs into the lungs every 6 (six) hours  as needed for wheezing or shortness of breath. 05/20/18 06/12/19  Orland Mustard, MD  SUMAtriptan (IMITREX) 50 MG tablet Take 1 tablet (50 mg total) by mouth every 2 (two) hours as needed for migraine. May repeat in 2 hours if headache persists or recurs. Patient not taking: Reported on 03/17/2020 05/09/19 08/10/20  Gailen Shelter, PA    Family History Family History  Problem Relation Age of Onset   Bipolar disorder Mother    Depression Mother    Migraines Father    ADD / ADHD Sister    Migraines Maternal Aunt    Cancer Maternal Grandfather    Heart Problems Paternal Grandfather     Social History Social History   Tobacco Use   Smoking status: Every Day    Types: Cigarettes   Smokeless tobacco: Never   Tobacco comments:    4 cigarettes per day  Vaping Use   Vaping status: Never Used  Substance Use Topics   Alcohol use: Yes    Comment: occ   Drug use: Yes    Types: Marijuana     Allergies   Peanut-containing drug products   Review of Systems Review of Systems  Skin:  Positive for rash.  All other systems reviewed and are negative.    Physical Exam Triage Vital Signs ED Triage Vitals  Encounter Vitals Group     BP 10/07/22 1401 111/76     Systolic BP Percentile --  Diastolic BP Percentile --      Pulse Rate 10/07/22 1401 76     Resp 10/07/22 1401 20     Temp 10/07/22 1401 97.8 F (36.6 C)     Temp Source 10/07/22 1401 Oral     SpO2 10/07/22 1401 100 %     Weight --      Height --      Head Circumference --      Peak Flow --      Pain Score 10/07/22 1358 0     Pain Loc --      Pain Education --      Exclude from Growth Chart --    No data found.  Updated Vital Signs BP 111/76 (BP Location: Left Arm) Comment (BP Location): large cuff  Pulse 76   Temp 97.8 F (36.6 C) (Oral)   Resp 20   LMP 09/02/2022   SpO2 100%   Visual Acuity Right Eye Distance:   Left Eye Distance:   Bilateral Distance:    Right Eye Near:   Left Eye Near:     Bilateral Near:     Physical Exam Vitals and nursing note reviewed.  Skin:    Findings: Erythema and rash present. Rash is pustular.     Comments: Dry excoriated skin, pustules noted to face  Neurological:     General: No focal deficit present.     Mental Status: She is alert and oriented to person, place, and time.     Cranial Nerves: Cranial nerves 2-12 are intact.     Sensory: Sensation is intact.     Motor: Motor function is intact.     Coordination: Coordination is intact.     Gait: Gait is intact.  Psychiatric:        Attention and Perception: Attention normal.        Mood and Affect: Mood normal.        Speech: Speech normal.        Behavior: Behavior normal.      UC Treatments / Results  Labs (all labs ordered are listed, but only abnormal results are displayed) Labs Reviewed  CERVICOVAGINAL ANCILLARY ONLY    EKG   Radiology No results found.  Procedures Procedures (including critical care time)  Medications Ordered in UC Medications - No data to display  Initial Impression / Assessment and Plan / UC Course  I have reviewed the triage vital signs and the nursing notes.  Pertinent labs & imaging results that were available during my care of the patient were reviewed by me and considered in my medical decision making (see chart for details).     Ddx: Eczema, STI, comedones, dermatitis Final Clinical Impressions(s) / UC Diagnoses   Final diagnoses:  Eczema, unspecified type  Screening examination for STI     Discharge Instructions      Daily allergy med of choice . Avoid heat,hot water as it makes rashes worse, use prednisone as directed.  Make sure to avoid excessive tubs pools baths as it will make your eczema worse.  Use Eucerin cream as label directed.  Safe sex, use condoms with all sexual activity.  Check MyChart for results    ED Prescriptions     Medication Sig Dispense Auth. Provider   methylPREDNISolone (MEDROL DOSEPAK) 4 MG TBPK  tablet Take as package directed, taper dose 21 tablet Marquinn Meschke, Para March, NP      PDMP not reviewed this encounter.   Clancy Gourd, NP 10/07/22 1829  Clancy Gourd, NP 10/07/22 1830

## 2022-10-07 NOTE — ED Triage Notes (Signed)
Patient also questioning std evaluation

## 2022-10-07 NOTE — ED Triage Notes (Signed)
Yesterday noticed bumps on legs, now has bumps on face and back.  Reports intermittent itching.  States she has eczema.  Patient is using triamcinolone and Eucerin  Patient spent time outside on go-carts

## 2022-10-07 NOTE — Discharge Instructions (Addendum)
Daily allergy med of choice . Avoid heat,hot water as it makes rashes worse, use prednisone as directed.  Make sure to avoid excessive tubs pools baths as it will make your eczema worse.  Use Eucerin cream as label directed.  Safe sex, use condoms with all sexual activity.  Check MyChart for results

## 2022-10-08 LAB — CERVICOVAGINAL ANCILLARY ONLY
Bacterial Vaginitis (gardnerella): POSITIVE — AB
Candida Glabrata: NEGATIVE
Candida Vaginitis: NEGATIVE
Chlamydia: NEGATIVE
Comment: NEGATIVE
Comment: NEGATIVE
Comment: NEGATIVE
Comment: NEGATIVE
Comment: NEGATIVE
Comment: NORMAL
Neisseria Gonorrhea: NEGATIVE
Trichomonas: NEGATIVE

## 2022-10-09 ENCOUNTER — Telehealth (HOSPITAL_COMMUNITY): Payer: Self-pay | Admitting: Emergency Medicine

## 2022-10-09 MED ORDER — METRONIDAZOLE 500 MG PO TABS: 500.0000 mg | ORAL_TABLET | Freq: Two times a day (BID) | ORAL | 0 refills | Status: DC

## 2023-03-03 ENCOUNTER — Emergency Department (HOSPITAL_COMMUNITY)
Admission: EM | Admit: 2023-03-03 | Discharge: 2023-03-03 | Disposition: A | Discharge status: Home / Self Care | Payer: Medicaid Other | Attending: Emergency Medicine | Admitting: Emergency Medicine

## 2023-03-03 ENCOUNTER — Emergency Department (HOSPITAL_COMMUNITY): Payer: Medicaid Other

## 2023-03-03 ENCOUNTER — Encounter (HOSPITAL_COMMUNITY): Payer: Self-pay | Admitting: Emergency Medicine

## 2023-03-03 ENCOUNTER — Other Ambulatory Visit: Payer: Self-pay

## 2023-03-03 DIAGNOSIS — Z91010 Allergy to peanuts: Secondary | ICD-10-CM | POA: Insufficient documentation

## 2023-03-03 DIAGNOSIS — S6992XA Unspecified injury of left wrist, hand and finger(s), initial encounter: Secondary | ICD-10-CM | POA: Diagnosis present

## 2023-03-03 DIAGNOSIS — J45909 Unspecified asthma, uncomplicated: Secondary | ICD-10-CM | POA: Insufficient documentation

## 2023-03-03 DIAGNOSIS — S61317A Laceration without foreign body of left little finger with damage to nail, initial encounter: Secondary | ICD-10-CM | POA: Insufficient documentation

## 2023-03-03 DIAGNOSIS — Z23 Encounter for immunization: Secondary | ICD-10-CM | POA: Insufficient documentation

## 2023-03-03 DIAGNOSIS — W290XXA Contact with powered kitchen appliance, initial encounter: Secondary | ICD-10-CM | POA: Insufficient documentation

## 2023-03-03 DIAGNOSIS — Y990 Civilian activity done for income or pay: Secondary | ICD-10-CM | POA: Diagnosis not present

## 2023-03-03 MED ORDER — TETANUS-DIPHTH-ACELL PERTUSSIS 5-2.5-18.5 LF-MCG/0.5 IM SUSY
0.5000 mL | PREFILLED_SYRINGE | Freq: Once | INTRAMUSCULAR | Status: AC
  Administered 2023-03-03: 0.5 mL via INTRAMUSCULAR
  Filled 2023-03-03: qty 0.5

## 2023-03-03 MED ORDER — HYDROCODONE-ACETAMINOPHEN 5-325 MG PO TABS: 1.0000 | ORAL_TABLET | Freq: Four times a day (QID) | ORAL | 0 refills | Status: DC | PRN

## 2023-03-03 MED ORDER — LIDOCAINE HCL (PF) 1 % IJ SOLN
5.0000 mL | Freq: Once | INTRAMUSCULAR | Status: AC
  Administered 2023-03-03: 5 mL
  Filled 2023-03-03: qty 5

## 2023-03-03 MED ORDER — ACETAMINOPHEN 500 MG PO TABS
1000.0000 mg | ORAL_TABLET | Freq: Once | ORAL | Status: AC
  Administered 2023-03-03: 1000 mg via ORAL
  Filled 2023-03-03: qty 2

## 2023-03-03 MED ORDER — IBUPROFEN 400 MG PO TABS
600.0000 mg | ORAL_TABLET | Freq: Once | ORAL | Status: AC
  Administered 2023-03-03: 600 mg via ORAL
  Filled 2023-03-03: qty 1

## 2023-03-03 MED ORDER — IBUPROFEN 600 MG PO TABS: 600.0000 mg | ORAL_TABLET | Freq: Four times a day (QID) | ORAL | 0 refills | Status: DC | PRN

## 2023-03-03 MED ORDER — CEPHALEXIN 250 MG PO CAPS
500.0000 mg | ORAL_CAPSULE | Freq: Once | ORAL | Status: AC
  Administered 2023-03-03: 500 mg via ORAL
  Filled 2023-03-03: qty 2

## 2023-03-03 MED ORDER — CEPHALEXIN 500 MG PO CAPS: 500.0000 mg | ORAL_CAPSULE | Freq: Four times a day (QID) | ORAL | 0 refills | Status: DC

## 2023-03-03 MED ORDER — FENTANYL CITRATE PF 50 MCG/ML IJ SOSY
50.0000 ug | PREFILLED_SYRINGE | Freq: Once | INTRAMUSCULAR | Status: AC
  Administered 2023-03-03: 50 ug via INTRAMUSCULAR
  Filled 2023-03-03: qty 1

## 2023-03-03 NOTE — ED Provider Triage Note (Signed)
Emergency Medicine Provider Triage Evaluation Note  PAMLER WELLE , a 27 y.o. female  was evaluated in triage.  Pt complains of cutting her left pinky finger with a meat slicer at work. Site actively bleeding upon arrival.  Pt reports significant pain, will give fentanyl and tylenol  Review of Systems  Positive: As above Negative: As above  Physical Exam  BP 107/79 (BP Location: Right Arm)   Pulse 80   Temp 98.3 F (36.8 C) (Oral)   Resp (!) 22   SpO2 100%  Gen:   Awake, no distress   Resp:  Normal effort  MSK:   Moves extremities without difficulty  Other:  Left pinky finger missing significant portion of skin on lateral edge  Medical Decision Making  Medically screening exam initiated at 9:58 AM.  Appropriate orders placed.  LECHELLE DALAL was informed that the remainder of the evaluation will be completed by another provider, this initial triage assessment does not replace that evaluation, and the importance of remaining in the ED until their evaluation is complete.     Arabella Merles, PA-C 03/03/23 1000

## 2023-03-03 NOTE — Discharge Instructions (Addendum)
It was a pleasure taking care of you. I have included the number of the hand surgeon. Call the office tomorrow. I am sending you home with pain medication. Take as needed for pain. I am also sending you home with an antibiotics. Take as prescribed and finish all antibiotics. Keep bandage on until evaluated by the hand surgeon. Return to the ER for new or worsening symptoms.

## 2023-03-03 NOTE — ED Provider Notes (Signed)
[facility] Provider Note   CSN: [id number] Arrival date & time: 03/03/23  0930     History  No chief complaint on file.   Stacy Mcgee is a 27 y.o. female with a past medical history significant for asthma, allergies, and hx of migraines who presents to the ED due to L pinky and L ring finger pain. She works as a Production designer, theatre/television/film at [employer] and she was working this morning cutting lettuce when she went to clear the lettuce off of the slicer and swiped her L hand across the blade. She hasn't taken anything for pain since the incident. She reports limited mobility of the 4th and 5th fingers on the L hand due to pain. She presents with her finger wrapped in gauze. Patient is right hand dominant. Unsure when her last tetanus shot was; however does not believe it was within the past 5 years. Denies any new numbness, weakness, loss of sensation to the hand.   History obtained from patient and past medical records. No interpreter used during encounter.       Home Medications Prior to Admission medications   Medication Sig Start Date End Date Taking? Authorizing Provider  cephALEXin (KEFLEX) 500 MG capsule Take 1 capsule (500 mg total) by mouth 4 (four) times daily. 03/03/23  Yes Lalita Ebel, Merla Riches, PA-C  HYDROcodone-acetaminophen (NORCO/VICODIN) 5-325 MG tablet Take 1 tablet by mouth every 6 (six) hours as needed. 03/03/23  Yes Felecia Stanfill C, PA-C  ibuprofen (ADVIL) 600 MG tablet Take 1 tablet (600 mg total) by mouth every 6 (six) hours as needed. 03/03/23  Yes Marrian Bells C, PA-C  ibuprofen (ADVIL) 800 MG tablet Take 1 tablet (800 mg total) by mouth every 8 (eight) hours as needed (pain). 05/25/21   Zenia Resides, MD  methylPREDNISolone (MEDROL DOSEPAK) 4 MG TBPK tablet Take as package directed, taper dose 10/07/22   Defelice, Para March, NP  metroNIDAZOLE (FLAGYL) 500 MG tablet Take 1 tablet (500 mg total) by mouth 2 (two) times daily.  10/09/22   Crain, Whitney L, PA  naproxen (NAPROSYN) 375 MG tablet Take 1 tablet (375 mg total) by mouth 2 (two) times daily with a meal. Patient not taking: Reported on 10/07/2022 03/08/22   Wallis Bamberg, PA-C  albuterol (PROVENTIL HFA;VENTOLIN HFA) 108 [phone] Base) MCG/ACT inhaler Inhale 1-2 puffs into the lungs every 6 (six) hours as needed for wheezing or shortness of breath. 05/20/18 06/12/19  Orland Mustard, MD  SUMAtriptan (IMITREX) 50 MG tablet Take 1 tablet (50 mg total) by mouth every 2 (two) hours as needed for migraine. May repeat in 2 hours if headache persists or recurs. Patient not taking: Reported on 03/17/2020 05/09/19 08/10/20  Gailen Shelter, PA      Allergies    Peanut-containing drug products    Review of Systems   Review of Systems  Skin:  Positive for wound.    Physical Exam Updated Vital Signs BP 117/75   Pulse 60   Temp 97.6 F (36.4 C) (Temporal)   Resp 20   Ht 5\' 3"  (1.6 m)   Wt 106.6 kg   LMP  (Within Months) Comment: Beginning of last month (November)  SpO2 100%   BMI 41.63 kg/m  Physical Exam Vitals and nursing note reviewed.  Constitutional:      General: She is not in acute distress.    Appearance: She is not ill-appearing.  HENT:     Head: Normocephalic.  Eyes:  Pupils: Pupils are equal, round, and reactive to light.  Cardiovascular:     Rate and Rhythm: Normal rate and regular rhythm.     Pulses: Normal pulses.     Heart sounds: Normal heart sounds. No murmur heard.    No friction rub. No gallop.  Pulmonary:     Effort: Pulmonary effort is normal.     Breath sounds: Normal breath sounds.  Abdominal:     General: Abdomen is flat. There is no distension.     Palpations: Abdomen is soft.     Tenderness: There is no abdominal tenderness. There is no guarding or rebound.  Musculoskeletal:        General: Normal range of motion.     Cervical back: Neck supple.     Comments: Decreased ROM of left pinky  Skin:    General: Skin is warm and  dry.     Comments: Left pinky with macerated skin. See photo below. Small superficial laceration to left 4th finger.   Neurological:     General: No focal deficit present.     Mental Status: She is alert.  Psychiatric:        Mood and Affect: Mood normal.        Behavior: Behavior normal.        ED Results / Procedures / Treatments   Labs (all labs ordered are listed, but only abnormal results are displayed) Labs Reviewed - No data to display  EKG None  Radiology DG Hand Complete Left  Result Date: 03/03/2023 CLINICAL DATA:  Laceration to pinky finger. EXAM: LEFT HAND - COMPLETE 3+ VIEW COMPARISON:  No comparison studies available. FINDINGS: Study limited by superimposition of the fingers on lateral projection. Within this limitation no evidence for an acute fracture or dislocation of the pinky finger. Soft tissue defect towards the tip is compatible laceration. No evidence for radiopaque soft tissue foreign body. IMPRESSION: Soft tissue laceration without evidence for radiopaque soft tissue foreign body or acute bony abnormality. Electronically Signed   By: Kennith Center M.D.   On: 03/03/2023 10:15    Procedures .Nerve Block  Date/Time: 03/03/2023 1:48 PM  Performed by: Mannie Stabile, PA-C Authorized by: Mannie Stabile, PA-C   Consent:    Consent obtained:  Verbal   Consent given by:  Patient   Risks, benefits, and alternatives were discussed: yes     Risks discussed:  Pain and infection   Alternatives discussed:  No treatment Universal protocol:    Procedure explained and questions answered to patient or proxy's satisfaction: yes     Relevant documents present and verified: yes     Test results available: yes     Imaging studies available: yes     Required blood products, implants, devices, and special equipment available: yes     Site/side marked: yes     Immediately prior to procedure, a time out was called: yes     Patient identity confirmed:  Verbally  with patient Indications:    Indications:  Pain relief Location:    Body area:  Upper extremity   Upper extremity nerve blocked: left 5th finger.   Laterality:  Left Pre-procedure details:    Skin preparation:  Alcohol Skin anesthesia:    Skin anesthesia method:  Local infiltration   Local anesthetic:  Lidocaine 1% w/o epi Procedure details:    Block needle gauge:  24 G   Anesthetic injected:  Lidocaine 1% w/o epi   Steroid injected:  None  Additive injected:  None   Injection procedure:  Introduced needle Post-procedure details:    Dressing: nonadhesive dressing.   Outcome:  Anesthesia achieved   Procedure completion:  Tolerated well, no immediate complications     Medications Ordered in ED Medications  fentaNYL (SUBLIMAZE) injection 50 mcg (50 mcg Intramuscular Given 03/03/23 1021)  acetaminophen (TYLENOL) tablet 1,000 mg (1,000 mg Oral Given 03/03/23 1020)  ibuprofen (ADVIL) tablet 600 mg (600 mg Oral Given 03/03/23 1134)  lidocaine (PF) (XYLOCAINE) 1 % injection 5 mL (5 mLs Infiltration Given 03/03/23 1256)  Tdap (BOOSTRIX) injection 0.5 mL (0.5 mLs Intramuscular Given 03/03/23 1258)  cephALEXin (KEFLEX) capsule 500 mg (500 mg Oral Given 03/03/23 1349)    ED Course/ Medical Decision Making/ A&P                                 Medical Decision Making Amount and/or Complexity of Data Reviewed Independent Historian: parent Radiology: ordered and independent interpretation performed. Decision-making details documented in ED Course.  Risk Prescription drug management.   27 year old female presents to the ED due to left pinky laceration.  Patient was using a cutter and cut left pinky at work.  Unsure when her last tetanus shot was.  Patient is right-hand dominant.  Upon arrival, stable vitals.  Patient in no acute distress.  5 cm laceration to ulnar aspect of left fifth finger with macerated skin. Decreased ROM due to pain. Radial pulse intact. Hemostasis achieved. X-ray  ordered to rule out bony fracture  Discussed with Dr. Denese Killings with hand surgery who recommends cleaning wound well and wrapping in nonadhesive bandage. He will follow-up with patient tomorrow in the office. He also recommends sending patient home with Keflex.   Digital nerve block performed to finger. Wound thoroughly cleaned. Nonadhesive wrap placed on finger. Tetanus updated. Patient discharged with pain medication and keflex. Hand surgery number given to patient at discharge and advised to call tomorrow. Patient stable for discharge. Strict ED precautions discussed with patient. Patient states understanding and agrees to plan. Patient discharged home in no acute distress and stable vitals  Hx asthma, hx migraines Has PCP Lives at home       Final Clinical Impression(s) / ED Diagnoses Final diagnoses:  Laceration of left little finger with damage to nail, foreign body presence unspecified, initial encounter    Rx / DC Orders ED Discharge Orders          Ordered    cephALEXin (KEFLEX) 500 MG capsule  4 times daily        03/03/23 1331    HYDROcodone-acetaminophen (NORCO/VICODIN) 5-325 MG tablet  Every 6 hours PRN        03/03/23 1332    ibuprofen (ADVIL) 600 MG tablet  Every 6 hours PRN        03/03/23 1332              Mannie Stabile, PA-C 03/03/23 1420    Derwood Kaplan, MD 03/07/23 2044

## 2023-03-03 NOTE — ED Triage Notes (Signed)
Pt BIB [facility] from work at [employer].  Pt finger cut in meat slicer.  Deep lac to left pinky.  Bleeding Is semi-controlled.  Oozing through gauze   164/100 HR 60RR 20 99% RA.

## 2023-03-05 ENCOUNTER — Ambulatory Visit (INDEPENDENT_AMBULATORY_CARE_PROVIDER_SITE_OTHER): Payer: Medicaid Other | Admitting: Orthopedic Surgery

## 2023-03-05 DIAGNOSIS — S61219A Laceration without foreign body of unspecified finger without damage to nail, initial encounter: Secondary | ICD-10-CM | POA: Diagnosis not present

## 2023-03-05 NOTE — Progress Notes (Signed)
Stacy Mcgee - 27 y.o. female MRN [id number]  Date of birth: 10/11/1995  Office Visit Note: Visit Date: 03/05/2023 PCP: Harvest Forest, MD Referred by: Harvest Forest, MD  Subjective: No chief complaint on file.  HPI: Stacy Mcgee is a pleasant 27 y.o. 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  She was seen in the emergency department setting the day of injury, he underwent bedside irrigation and dressing placement.  Given the amount of soft tissue loss, closure was unable to be performed.  Pertinent ROS were reviewed with the patient and found to be negative unless otherwise specified above in HPI.   Visit Reason: left small finger shear injury, meat slicer Duration of symptoms: 2 days Hand dominance: right Occupation: [employer] Diabetic: No Smoking: Yes Heart/Lung History: asthma Blood Thinners:  none   Assessment & Plan: Visit Diagnoses:  1. Deep laceration of finger     Plan: Based on clinical examination today, she does have a significant soft tissue injury of the ulnar border of the left small finger.  Fortunately, her flexor and extensor tendon of the small finger are intact on clinical examination today.  Her shear injury is relatively superficial, there is evidence of granulation tissue without exposed bone along the ulnar border.  Sensation does appear intact distally on clinical examination, however it is difficult to ascertain 2-point discrimination along the ulnar border given the significant pain ongoing on examination today.  Digital block was then performed of the small finger today in order to appropriately remove the dressing and examine the finger.  We discussed treatment options, either allowing the injury to heal with secondary intention versus placement of a localized skin graft, likely from the hypothenar portion of the hand.  Understanding both options, patient  would like to move forward with placement of skin graft in order to obtain appropriate coverage, which I am in agreement with.  Risk and benefits of the skin graft procedure were discussed in detail today.  Risks including but not limited to infection, bleeding, scarring, stiffness, nerve injury, vascular, tendon injury, failure of healing, wound complications, persistent need for wound coverage and need for subsequent operation.  Patient consented understand the above.  Will move forward with left small finger irrigation and debridement, placement of hypothenar skin graft in the operating room.  Surgery will performed at the next available date.  Greater than 45 minutes was spent in the care of this patient today, during previous documentation, notes, imaging as well as in examination and discussion with patient today.  Follow-up: No follow-ups on file.   Meds & Orders: No orders of the defined types were placed in this encounter.  No orders of the defined types were placed in this encounter.    Procedures: No procedures performed      Clinical History: No specialty comments available.  She reports that she has been smoking cigarettes. She has never used smokeless tobacco. No results for input(s): "HGBA1C", "LABURIC" in the last 8760 hours.  Objective:   Vital Signs: LMP  (Within Months)   Physical Exam  Gen: Well-appearing, in no acute distress; non-toxic CV: Regular Rate. Well-perfused. Warm.  Resp: Breathing unlabored on room air; no wheezing. Psych: Fluid speech in conversation; appropriate affect; normal thought process  Ortho Exam Left hand: - Small finger with significant shear injury along the ulnar border extending from the level of the nail plate to  the PIP level - There is notable granulation tissue evident along the ulnar border of the small finger without exposed bone - Able to perform flexion and extension of the PIP and DIP of the small finger - Sensation to light  touch is intact at the distal pulp of the small finger, unable to fully discern 2 point discrimination along the ulnar border due to significant pain  Imaging: No results found.  Past Medical/Family/Surgical/Social History: Medications & Allergies reviewed per EMR, new medications updated. Patient Active Problem List   Diagnosis Date Noted   Eczema 10/07/2022   Screening examination for STI 10/07/2022   Migraine without aura and without status migrainosus, not intractable 03/04/2015   Tension headache 03/04/2015   Sleeping difficulty 03/04/2015   Past Medical History:  Diagnosis Date   Asthma    Eczema    Generalized headaches    Seasonal allergies    Family History  Problem Relation Age of Onset   Bipolar disorder Mother    Depression Mother    Migraines Father    ADD / ADHD Sister    Migraines Maternal Aunt    Cancer Maternal Grandfather    Heart Problems Paternal Grandfather    No past surgical history on file. Social History   Occupational History   Not on file  Tobacco Use   Smoking status: Every Day    Types: Cigarettes   Smokeless tobacco: Never   Tobacco comments:    4 cigarettes per day  Vaping Use   Vaping status: Never Used  Substance and Sexual Activity   Alcohol use: Yes    Comment: occ   Drug use: Yes    Types: Marijuana   Sexual activity: Yes    Birth control/protection: None    Tetsuo Coppola Trevor Mace, M.D. [facility] 5:47 PM

## 2023-03-07 ENCOUNTER — Other Ambulatory Visit: Payer: Self-pay | Admitting: Orthopedic Surgery

## 2023-03-07 DIAGNOSIS — S61217A Laceration without foreign body of left little finger without damage to nail, initial encounter: Secondary | ICD-10-CM | POA: Diagnosis not present

## 2023-03-07 MED ORDER — HYDROCODONE-ACETAMINOPHEN 5-325 MG PO TABS: 1.0000 | ORAL_TABLET | Freq: Four times a day (QID) | ORAL | 0 refills | Status: DC | PRN

## 2023-03-13 ENCOUNTER — Ambulatory Visit (INDEPENDENT_AMBULATORY_CARE_PROVIDER_SITE_OTHER): Payer: Medicaid Other | Admitting: Orthopedic Surgery

## 2023-03-13 ENCOUNTER — Telehealth: Payer: Self-pay

## 2023-03-13 DIAGNOSIS — S61219A Laceration without foreign body of unspecified finger without damage to nail, initial encounter: Secondary | ICD-10-CM

## 2023-03-13 NOTE — Telephone Encounter (Signed)
Patient would like a call back concerning her pinky finger being in severe pain.  Stated that her bandage had came off while she was asleep.  Cb# [phone].  Please advise.  Thank you

## 2023-03-13 NOTE — Telephone Encounter (Signed)
Patient coming in today to be seen.

## 2023-03-15 ENCOUNTER — Encounter: Payer: Medicaid Other | Admitting: Orthopedic Surgery

## 2023-03-15 ENCOUNTER — Ambulatory Visit (INDEPENDENT_AMBULATORY_CARE_PROVIDER_SITE_OTHER): Payer: Medicaid Other | Admitting: Orthopedic Surgery

## 2023-03-15 DIAGNOSIS — S61219A Laceration without foreign body of unspecified finger without damage to nail, initial encounter: Secondary | ICD-10-CM

## 2023-03-15 MED ORDER — IBUPROFEN 800 MG PO TABS: 800.0000 mg | ORAL_TABLET | Freq: Three times a day (TID) | ORAL | 0 refills | Status: DC | PRN

## 2023-03-15 NOTE — Progress Notes (Unsigned)
   Stacy Mcgee - 27 y.o. female MRN [id number]  Date of birth: 1995/07/20  Office Visit Note: Visit Date: 03/15/2023 PCP: Harvest Forest, MD Referred by: Harvest Forest, MD  Subjective:  HPI: Stacy Mcgee is a 27 y.o. female who presents today for follow up 1 week status post left small finger hypothenar skin graft.  Pertinent ROS were reviewed with the patient and found to be negative unless otherwise specified above in HPI.   Assessment & Plan: Visit Diagnoses: No diagnosis found.  Plan: ***  Follow-up: No follow-ups on file.   Meds & Orders: No orders of the defined types were placed in this encounter.  No orders of the defined types were placed in this encounter.    Procedures: No procedures performed       Objective:   Vital Signs: LMP  (Within Months)   Ortho Exam ***  Imaging: No results found.   Murry Khiev Trevor Mace, M.D. [facility] 10:40 AM

## 2023-03-16 NOTE — Progress Notes (Signed)
   ANEVAEH Mcgee - 27 y.o. female MRN [id number]  Date of birth: 10-06-1995  Office Visit Note: Visit Date: 03/13/2023 PCP: Harvest Forest, MD Referred by: Harvest Forest, MD  Subjective: No chief complaint on file.  HPI: Stacy Mcgee is a pleasant 27 y.o. female who presents today for follow-up 5 days status post left hand hypothenar skin graft for small finger shear injury.  She was concerned that her dressing may becoming unraveled, presents today for recheck.  Pertinent ROS were reviewed with the patient and found to be negative unless otherwise specified above in HPI.     Assessment & Plan: Visit Diagnoses: No diagnosis found.  Plan: Dressing appears fully intact today, we discussed that we would like to keep dressing in place for 1 week as planned.  She should follow-up for her originally scheduled visit on Friday of this week for dressing change and wound evaluation.  She expressed full understanding.  Follow-up: No follow-ups on file.   Meds & Orders: No orders of the defined types were placed in this encounter.  No orders of the defined types were placed in this encounter.    Procedures: No procedures performed      Clinical History: No specialty comments available.  She reports that she has been smoking cigarettes. She has never used smokeless tobacco. No results for input(s): "HGBA1C", "LABURIC" in the last 8760 hours.  Objective:   Vital Signs: LMP  (Within Months)   Physical Exam  Gen: Well-appearing, in no acute distress; non-toxic CV: Regular Rate. Well-perfused. Warm.  Resp: Breathing unlabored on room air; no wheezing. Psych: Fluid speech in conversation; appropriate affect; normal thought process  Ortho Exam Left hand bulky dressing in place, pain is controlled  Imaging: No results found.  Past Medical/Family/Surgical/Social History: Medications & Allergies reviewed per EMR, new medications updated. Patient Active Problem List    Diagnosis Date Noted   Eczema 10/07/2022   Screening examination for STI 10/07/2022   Migraine without aura and without status migrainosus, not intractable 03/04/2015   Tension headache 03/04/2015   Sleeping difficulty 03/04/2015   Past Medical History:  Diagnosis Date   Asthma    Eczema    Generalized headaches    Seasonal allergies    Family History  Problem Relation Age of Onset   Bipolar disorder Mother    Depression Mother    Migraines Father    ADD / ADHD Sister    Migraines Maternal Aunt    Cancer Maternal Grandfather    Heart Problems Paternal Grandfather    No past surgical history on file. Social History   Occupational History   Not on file  Tobacco Use   Smoking status: Every Day    Types: Cigarettes   Smokeless tobacco: Never   Tobacco comments:    4 cigarettes per day  Vaping Use   Vaping status: Never Used  Substance and Sexual Activity   Alcohol use: Yes    Comment: occ   Drug use: Yes    Types: Marijuana   Sexual activity: Yes    Birth control/protection: None    Stacy Mcgee, M.D.  [facility] 8:25 AM

## 2023-03-22 ENCOUNTER — Ambulatory Visit (INDEPENDENT_AMBULATORY_CARE_PROVIDER_SITE_OTHER): Payer: Medicaid Other | Admitting: Orthopedic Surgery

## 2023-03-22 DIAGNOSIS — S61219A Laceration without foreign body of unspecified finger without damage to nail, initial encounter: Secondary | ICD-10-CM

## 2023-03-22 NOTE — Progress Notes (Signed)
   Stacy Mcgee - 27 y.o. female MRN [id number]  Date of birth: 28-May-1995  Office Visit Note: Visit Date: 03/22/2023 PCP: Harvest Forest, MD Referred by: Harvest Forest, MD  Subjective:  HPI: Stacy Mcgee is a 27 y.o. female who presents today for follow up 2 weeks status post left small finger hypothenar skin graft .  She is doing well overall, has been doing dressing changes as instructed.  Pertinent ROS were reviewed with the patient and found to be negative unless otherwise specified above in HPI.   Assessment & Plan: Visit Diagnoses: No diagnosis found.  Plan: She demonstrates appropriate wound healing at both the donor site and the skin graft region.  Sutures will remain in for additional 1 week.  Continue with daily dressing changes as instructed.  Follow-up in 1 week for wound check and likely suture removal at that time.  Follow-up: No follow-ups on file.   Meds & Orders: No orders of the defined types were placed in this encounter.  No orders of the defined types were placed in this encounter.    Procedures: No procedures performed       Objective:   Vital Signs: LMP  (Within Months)   Ortho Exam Left hand: - Well-healing incision at the donor site, well-healing skin graft region over the ulnar aspect of the small finger with appropriate uptake - No significant erythema or drainage  Imaging: No results found.   Stacy Mcgee Stacy Mcgee, M.D. [facility] 9:30 AM

## 2023-03-27 NOTE — L&D Delivery Note (Signed)
 OB/GYN Faculty Practice Delivery Note  Stacy Mcgee is a 29 y.o. G1P0 s/p SVD at [redacted]w[redacted]d. She was admitted for SROM.   ROM: 68h 6m with reportedly clear fluid fluid GBS Status: POSITIVE/-- (09/16 0122) treated with PCN on arrival but ruptured 48 hours prior  Maximum Maternal Temperature: 99.4   Labor Progress: Initial SVE: 4/60/-3. She then progressed to complete.   Delivery Date/Time: 12/11/2023 at 0630 Delivery: Called to room and patient was complete and pushing. Head delivered ROA. No nuchal cord present. Shoulder and body delivered in usual fashion. Infant with spontaneous cry, placed on mother's abdomen, dried and stimulated. Cord clamped x 2 after 1-minute delay, and cut by FOB. Cord blood drawn. Placenta delivered spontaneously with gentle cord traction. Fundus firm with massage and Pitocin . Labia, perineum, vagina, and cervix inspected inspected with mild abrasions (all hemostatic) but no lacerations.  Baby Weight: pending  Placenta: Sent to L&D Complications: None Lacerations: None EBL: 140 mL Analgesia: Epidural   Infant:  APGAR (1 MIN): 8  APGAR (5 MINS): 9

## 2023-04-01 ENCOUNTER — Ambulatory Visit: Payer: Medicaid Other | Admitting: Orthopedic Surgery

## 2023-04-01 DIAGNOSIS — S61219A Laceration without foreign body of unspecified finger without damage to nail, initial encounter: Secondary | ICD-10-CM

## 2023-04-01 NOTE — Progress Notes (Signed)
   Stacy Mcgee - 28 y.o. female MRN [id number]  Date of birth: 1995/11/30  Office Visit Note: Visit Date: 04/01/2023 PCP: Roanna Ezekiel NOVAK, MD Referred by: Roanna Ezekiel NOVAK, MD  Subjective:  HPI: Stacy Mcgee is a 28 y.o. female who presents today for follow up 4 weeks status post left small finger hypothenar skin graft.  She has been doing dressing changes, does have some mild drainage at the skin graft site.  Pertinent ROS were reviewed with the patient and found to be negative unless otherwise specified above in HPI.   Assessment & Plan: Visit Diagnoses: No diagnosis found.  Plan: Sutures were removed today from both the donor site and the skin graft site.  There is slight elevation of the skin graft on examination today with some mild serosanguineous drainage.  I recommended that she continue with dressing changes as needed.  She can begin to allow the wound to get wet at this point.  I did explain to her that should the skin graft site unroofed slightly, there is evidence of granulation tissue beneath which can continue to fill in the defect.  Return in 2 weeks for wound check.  Remain out of work until that time.  Follow-up: No follow-ups on file.   Meds & Orders: No orders of the defined types were placed in this encounter.  No orders of the defined types were placed in this encounter.    Procedures: No procedures performed       Objective:   Vital Signs: LMP  (Within Months)   Ortho Exam - Well-healing incision at the donor site, well-healing skin graft region over the ulnar aspect of the small finger with appropriate adherence, base of the skin graft material does have some mild elevation with slight serosanguineous material beneath, there is evidence of granulation tissue  Imaging: No results found.   Willie Loy Afton Alderton, M.D. [facility] 1:59 PM

## 2023-04-16 ENCOUNTER — Encounter: Payer: Medicaid Other | Admitting: Orthopedic Surgery

## 2023-05-02 ENCOUNTER — Ambulatory Visit: Payer: Medicaid Other | Admitting: Orthopedic Surgery

## 2023-05-02 DIAGNOSIS — S61219A Laceration without foreign body of unspecified finger without damage to nail, initial encounter: Secondary | ICD-10-CM

## 2023-05-02 NOTE — Progress Notes (Signed)
   Stacy Mcgee - 28 y.o. female MRN [id number]  Date of birth: [birth year]  Office Visit Note: Visit Date: 05/02/2023 PCP: Roanna Ezekiel NOVAK, MD Referred by: Roanna Ezekiel NOVAK, MD  Subjective:  HPI: Stacy Mcgee is a 28 y.o. female who presents today for follow up 8 weeks status post left small finger hypothenar skin graft placement.  She has done very well postoperatively, demonstrates appropriate healing on examination today and is pleased with her progress.  Pertinent ROS were reviewed with the patient and found to be negative unless otherwise specified above in HPI.   Assessment & Plan: Visit Diagnoses: No diagnosis found.  Plan: She has done very well postoperatively and demonstrates appropriate healing at this juncture.  She can continue with activity as tolerated advance her activities without restriction.  She can return to work at this time without any restrictions.  Will have her see OT for range of motion exercises and transition to home exercise program.  Follow-up as needed.  Follow-up: No follow-ups on file.   Meds & Orders: No orders of the defined types were placed in this encounter.  No orders of the defined types were placed in this encounter.    Procedures: No procedures performed       Objective:   Vital Signs: There were no vitals taken for this visit.  Ortho Exam - Well-healed incision at the donor site, well-healing skin graft region over the ulnar aspect of the small finger - Digital range of motion is well-preserved, near composite fist without significant restriction, slightly limited motion at the DIP and PIP of the small finger - Sensation is intact distally, hand is warm well-perfused  Imaging: No results found.   Giulian Goldring Afton Alderton, M.D. [facility] 9:29 AM

## 2023-05-06 IMAGING — CT CT MAXILLOFACIAL W/O CM
3 series · 15 of 47 positions shown, 18 images · non-contrast
Comparison: March 17, 2020 and September 15, 2017.

CLINICAL DATA: Facial trauma; Neck trauma, midline tenderness (Age
16-64y); Facial trauma nasal and left inferior orbital tenderness to
palpation.

EXAM:
CT HEAD WITHOUT CONTRAST
CT MAXILLOFACIAL WITHOUT CONTRAST
CT CERVICAL SPINE WITHOUT CONTRAST
TECHNIQUE: Multidetector CT imaging of the head, cervical spine, and
maxillofacial structures were performed using the standard protocol
without intravenous contrast. Multiplanar CT image reconstructions
of the cervical spine and maxillofacial structures were also
generated.

[Series 4: facialbone 2.0 st · axial · 0.42mm/px · z∈[-230,-80]mm · 9 of 87 slices shown, 12 images]
[im 6/87  brain]
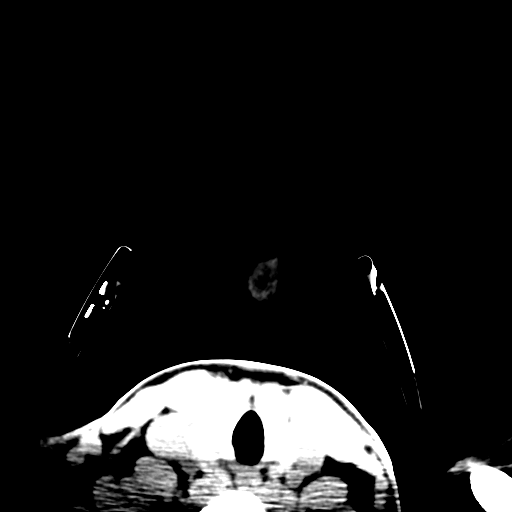
[im 6/87  bone]
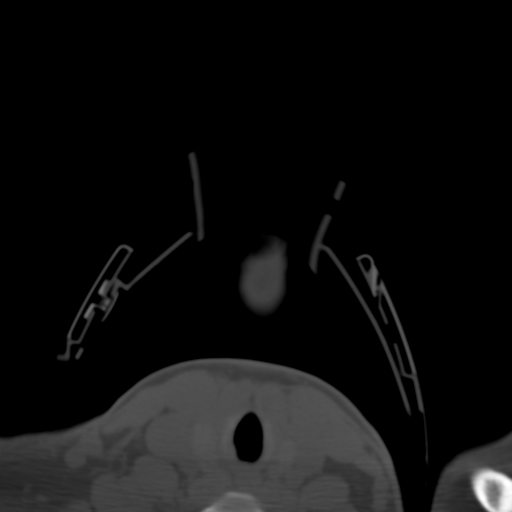
[im 15/87  bone]
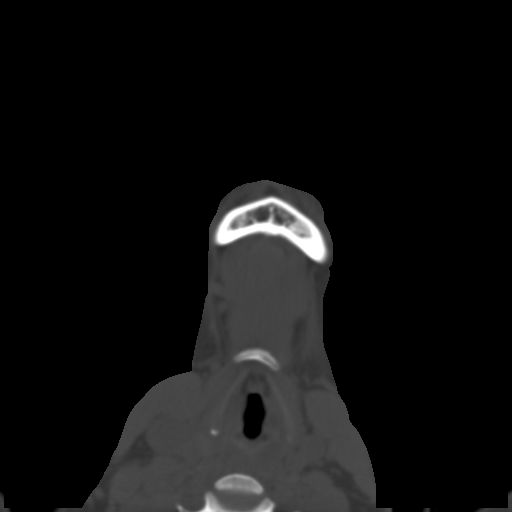
[im 24/87  bone]
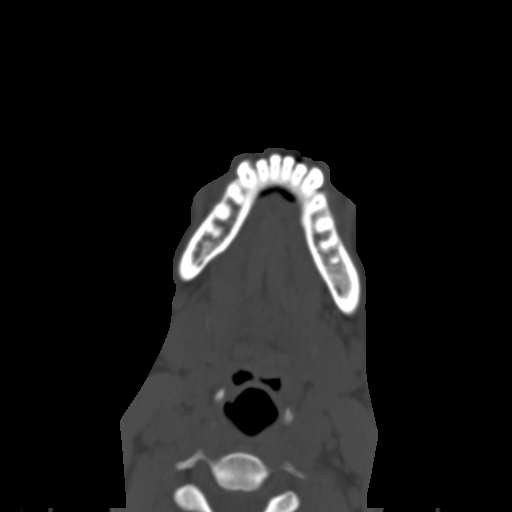
[im 33/87  bone]
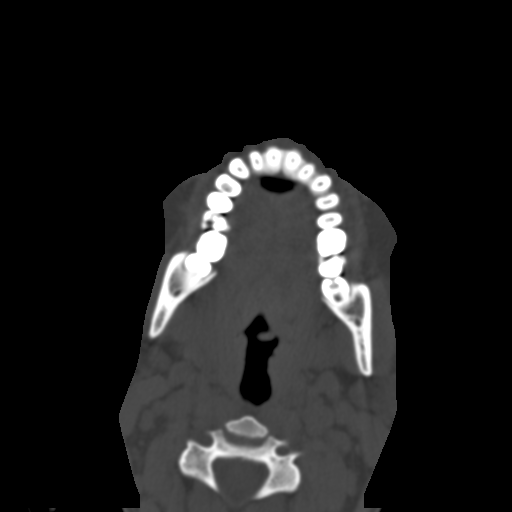
[im 45/87  brain]
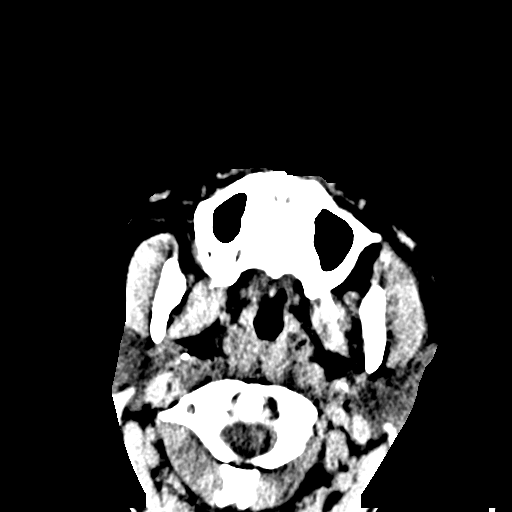
[im 45/87  bone]
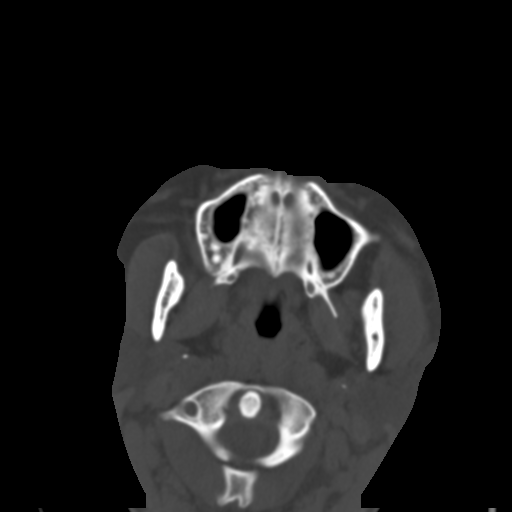
[im 54/87  bone]
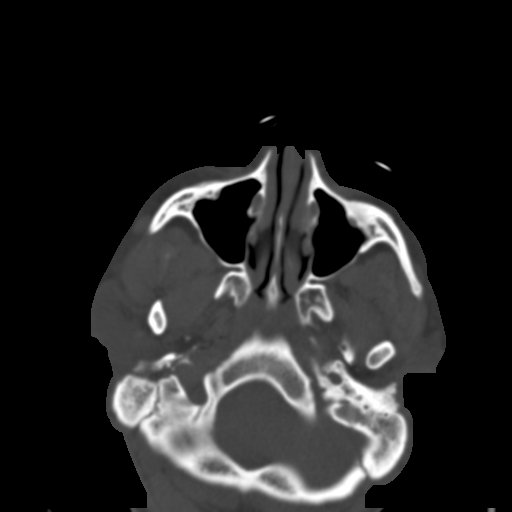
[im 63/87  bone]
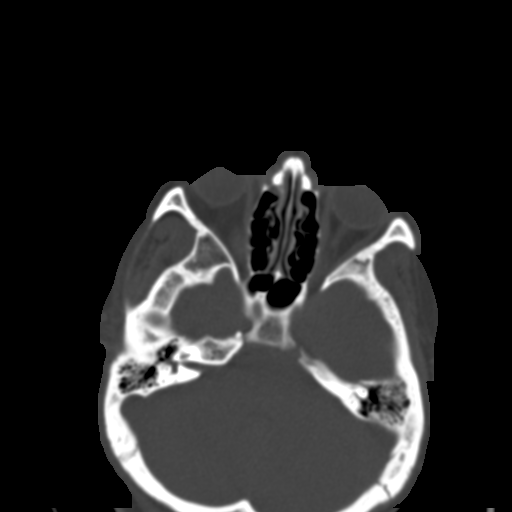
[im 72/87  bone]
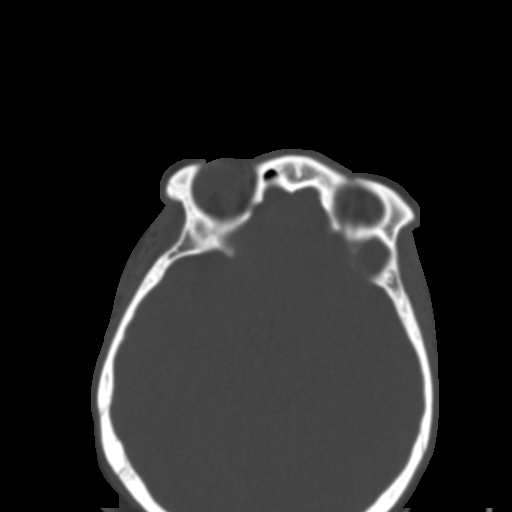
[im 81/87  brain]
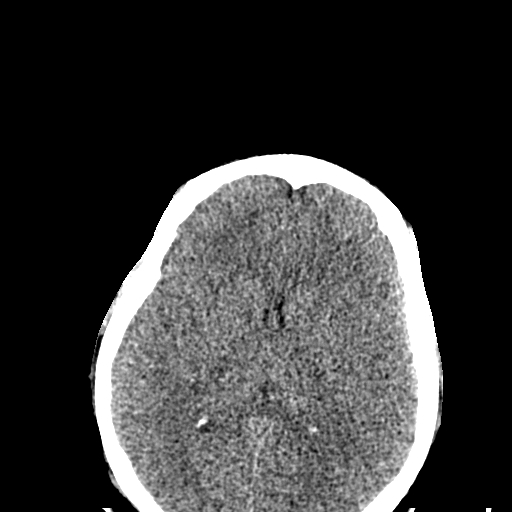
[im 81/87  bone]
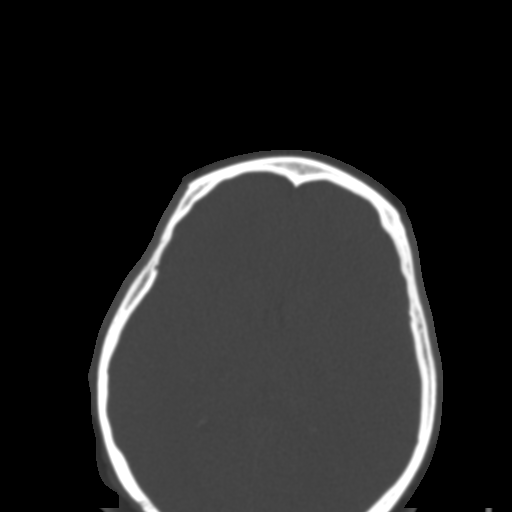

[Series 10: facialbone 2.0 cor st · coronal · 0.35mm/px · 3 of 94 slices shown]
[im 32/94  bone]
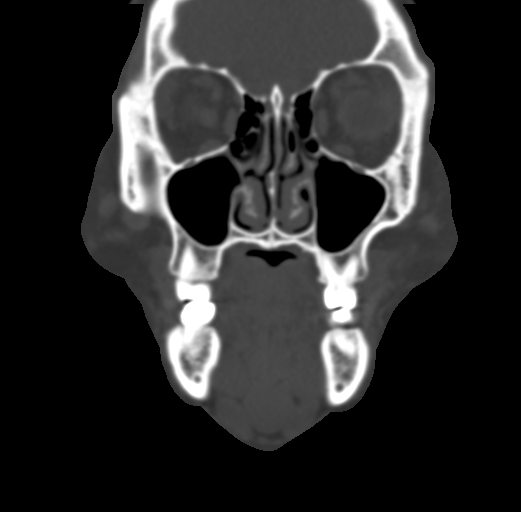
[im 42/94  bone]
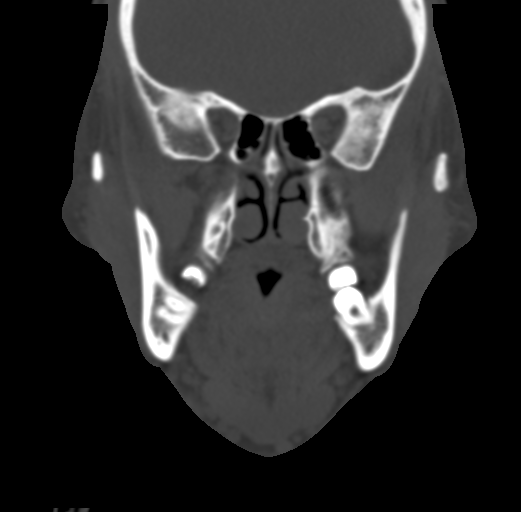
[im 52/94  bone]
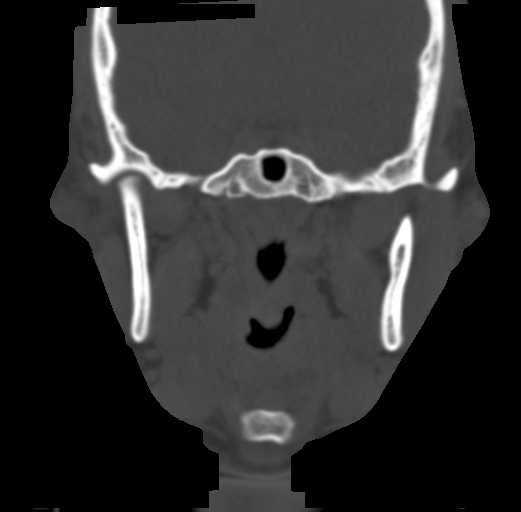

[Series 11: facialbone 2.0 sag st · sagittal · 0.38mm/px · 3 of 76 slices shown]
[im 26/76  bone]
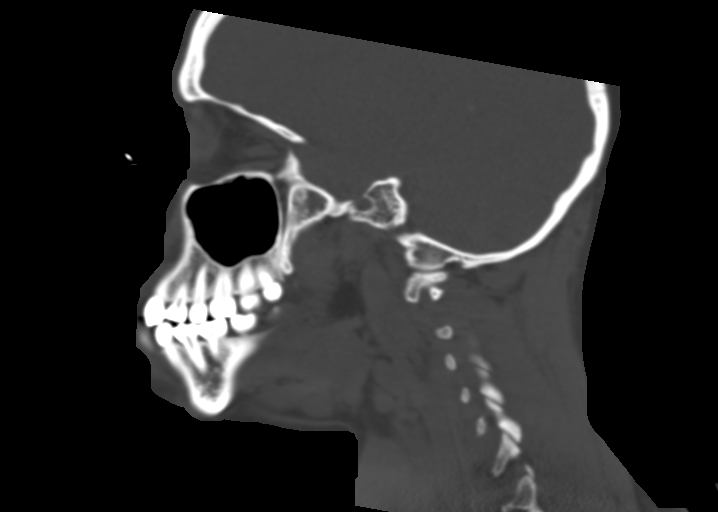
[im 38/76  bone]
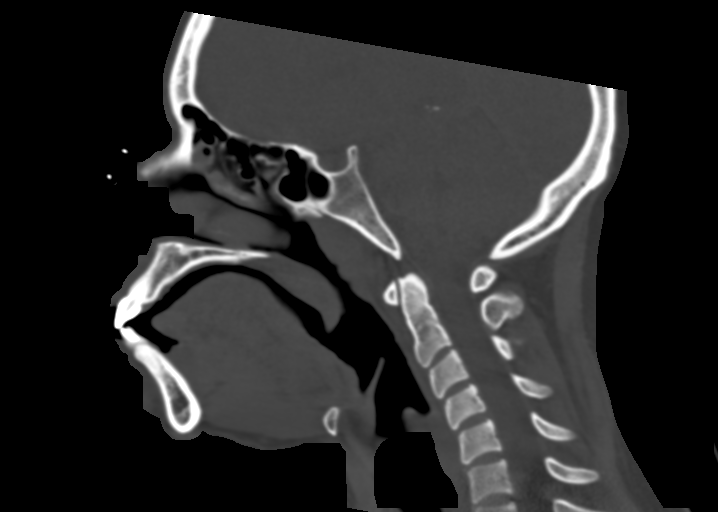
[im 51/76  bone]
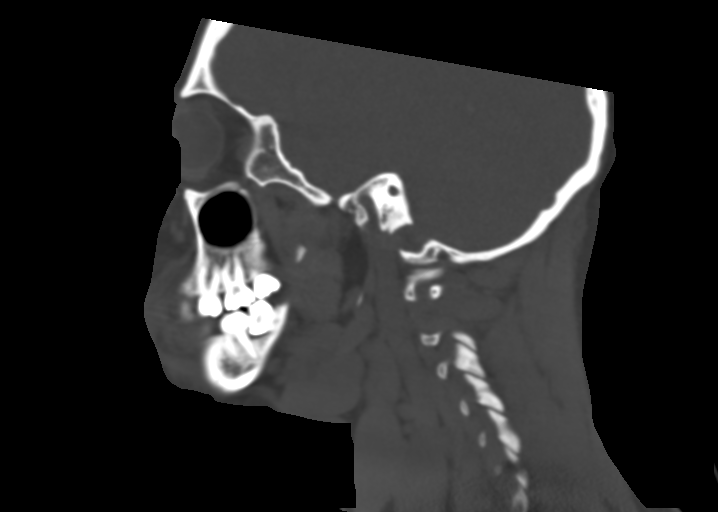

[15 of 47 positions shown; findings below may reference images not displayed]

FINDINGS: CT HEAD FINDINGS

Brain: No evidence of acute infarction, hemorrhage, hydrocephalus,
extra-axial collection or mass lesion/mass effect.

Vascular: No hyperdense vessel or unexpected calcification.

Skull: Normal. Negative for fracture or focal lesion.

Other: None.

CT MAXILLOFACIAL FINDINGS

Osseous: Very subtle rightward angulation of the nasal bone for
instance on image 58/5. No displaced fracture or mandibular
dislocation.

Orbits: Negative. No traumatic or inflammatory finding.

Sinuses: Polypoid mucosal thickening in the right maxillary sinus
otherwise the paranasal sinuses and mastoid air cells are
predominantly clear.

Soft tissues: Very mild soft tissue swelling over the left maxilla.

CT CERVICAL SPINE FINDINGS

Alignment: Reversal of the normal cervical lordosis, commonly
positional.

Skull base and vertebrae: No acute fracture. No primary bone lesion
or focal pathologic process.

Soft tissues and spinal canal: No prevertebral fluid or swelling. No
visible canal hematoma.

Disc levels:  Preserved

Upper chest: Negative.

Other: None
IMPRESSION: 1. No acute intracranial abnormality.
2. Very subtle rightward angulation of the nasal bone.
3. Very mild soft tissue swelling over the left maxilla.
4. No acute fracture or subluxation of the cervical spine.

## 2023-05-10 ENCOUNTER — Inpatient Hospital Stay (HOSPITAL_COMMUNITY): Payer: Medicaid Other

## 2023-05-10 ENCOUNTER — Inpatient Hospital Stay (HOSPITAL_COMMUNITY)
Admission: AD | Admit: 2023-05-10 | Discharge: 2023-05-10 | Disposition: A | Discharge status: Home / Self Care | Payer: Medicaid Other | Attending: Obstetrics and Gynecology | Admitting: Obstetrics and Gynecology

## 2023-05-10 ENCOUNTER — Encounter (HOSPITAL_COMMUNITY): Payer: Self-pay | Admitting: *Deleted

## 2023-05-10 DIAGNOSIS — Z3A08 8 weeks gestation of pregnancy: Secondary | ICD-10-CM | POA: Diagnosis not present

## 2023-05-10 DIAGNOSIS — F17210 Nicotine dependence, cigarettes, uncomplicated: Secondary | ICD-10-CM | POA: Insufficient documentation

## 2023-05-10 DIAGNOSIS — B9689 Other specified bacterial agents as the cause of diseases classified elsewhere: Secondary | ICD-10-CM

## 2023-05-10 DIAGNOSIS — R1030 Lower abdominal pain, unspecified: Secondary | ICD-10-CM

## 2023-05-10 DIAGNOSIS — O99331 Smoking (tobacco) complicating pregnancy, first trimester: Secondary | ICD-10-CM | POA: Insufficient documentation

## 2023-05-10 DIAGNOSIS — O3481 Maternal care for other abnormalities of pelvic organs, first trimester: Secondary | ICD-10-CM | POA: Diagnosis not present

## 2023-05-10 DIAGNOSIS — O26891 Other specified pregnancy related conditions, first trimester: Secondary | ICD-10-CM | POA: Diagnosis not present

## 2023-05-10 DIAGNOSIS — R109 Unspecified abdominal pain: Secondary | ICD-10-CM | POA: Insufficient documentation

## 2023-05-10 DIAGNOSIS — Z3A01 Less than 8 weeks gestation of pregnancy: Secondary | ICD-10-CM | POA: Diagnosis not present

## 2023-05-10 DIAGNOSIS — O26899 Other specified pregnancy related conditions, unspecified trimester: Secondary | ICD-10-CM

## 2023-05-10 DIAGNOSIS — N83201 Unspecified ovarian cyst, right side: Secondary | ICD-10-CM | POA: Insufficient documentation

## 2023-05-10 LAB — COMPREHENSIVE METABOLIC PANEL
ALT: 12 U/L (ref 0–44)
AST: 15 U/L (ref 15–41)
Albumin: 3.7 g/dL (ref 3.5–5.0)
Alkaline Phosphatase: 48 U/L (ref 38–126)
Anion gap: 11 (ref 5–15)
BUN: 6 mg/dL (ref 6–20)
CO2: 23 mmol/L (ref 22–32)
Calcium: 9.2 mg/dL (ref 8.9–10.3)
Chloride: 102 mmol/L (ref 98–111)
Creatinine, Ser: 0.61 mg/dL (ref 0.44–1.00)
GFR, Estimated: >60 mL/min (ref >60)
Glucose, Bld: 99 mg/dL (ref 70–99)
Potassium: 3.6 mmol/L (ref 3.5–5.1)
Sodium: 136 mmol/L (ref 135–145)
Total Bilirubin: 0.6 mg/dL (ref 0.0–1.2)
Total Protein: 7 g/dL (ref 6.5–8.1)

## 2023-05-10 LAB — URINALYSIS, ROUTINE W REFLEX MICROSCOPIC
Bilirubin Urine: NEGATIVE
Glucose, UA: NEGATIVE mg/dL
Hgb urine dipstick: NEGATIVE
Ketones, ur: 20 mg/dL — AB
Leukocytes,Ua: NEGATIVE
Nitrite: NEGATIVE
Protein, ur: NEGATIVE mg/dL
Specific Gravity, Urine: 1.023 (ref 1.005–1.030)
pH: 5 (ref 5.0–8.0)

## 2023-05-10 LAB — HCG, QUANTITATIVE, PREGNANCY: hCG, Beta Chain, Quant, S: 27245 m[IU]/mL — ABNORMAL HIGH (ref <5)

## 2023-05-10 LAB — CBC
HCT: 37.1 % (ref 36.0–46.0)
Hemoglobin: 12.2 g/dL (ref 12.0–15.0)
MCH: 28.2 pg (ref 26.0–34.0)
MCHC: 32.9 g/dL (ref 30.0–36.0)
MCV: 85.9 fL (ref 80.0–100.0)
Platelets: 439 10*3/uL — ABNORMAL HIGH (ref 150–400)
RBC: 4.32 MIL/uL (ref 3.87–5.11)
RDW: 14.7 % (ref 11.5–15.5)
WBC: 11.3 10*3/uL — ABNORMAL HIGH (ref 4.0–10.5)
nRBC: 0 % (ref 0.0–0.2)

## 2023-05-10 LAB — POCT PREGNANCY, URINE: Preg Test, Ur: POSITIVE — AB

## 2023-05-10 LAB — WET PREP, GENITAL
Sperm: NONE SEEN
Trich, Wet Prep: NONE SEEN
WBC, Wet Prep HPF POC: <10 (ref <10)
Yeast Wet Prep HPF POC: NONE SEEN

## 2023-05-10 LAB — ABO/RH: ABO/RH(D): B POS

## 2023-05-10 MED ORDER — CYCLOBENZAPRINE HCL 5 MG PO TABS
10.0000 mg | ORAL_TABLET | Freq: Once | ORAL | Status: AC
  Administered 2023-05-10: 10 mg via ORAL
  Filled 2023-05-10: qty 2

## 2023-05-10 MED ORDER — ACETAMINOPHEN 325 MG PO TABS
650.0000 mg | ORAL_TABLET | Freq: Once | ORAL | Status: AC
  Administered 2023-05-10: 650 mg via ORAL
  Filled 2023-05-10: qty 2

## 2023-05-10 MED ORDER — DOXYLAMINE-PYRIDOXINE 10-10 MG PO TBEC: 2.0000 | DELAYED_RELEASE_TABLET | Freq: Every day | ORAL | 2 refills | Status: DC

## 2023-05-10 MED ORDER — METRONIDAZOLE 0.75 % VA GEL: 1.0000 | Freq: Every day | VAGINAL | 1 refills | Status: DC

## 2023-05-10 NOTE — Discharge Instructions (Signed)
Safe Medications in Pregnancy   Acne: Benzoyl Peroxide Salicylic Acid  Backache/Headache: Tylenol: 2 regular strength every 4 hours OR              2 Extra strength every 6 hours  Colds/Coughs/Allergies: Benadryl (alcohol free) 25 mg every 6 hours as needed Breath right strips Claritin Cepacol throat lozenges Chloraseptic throat spray Cold-Eeze- up to three times per day Cough drops, alcohol free Flonase (by prescription only) Guaifenesin Mucinex Robitussin DM (plain only, alcohol free) Saline nasal spray/drops Sudafed (pseudoephedrine) & Actifed ** use only after [redacted] weeks gestation and if you do not have high blood pressure Tylenol Vicks Vaporub Zinc lozenges Zyrtec   Constipation: Colace Ducolax suppositories Fleet enema Glycerin suppositories Metamucil Milk of magnesia Miralax Senokot Smooth move tea  Diarrhea: Kaopectate Imodium A-D  *NO pepto Bismol  Hemorrhoids: Anusol Anusol HC Preparation H Tucks  Indigestion: Tums Maalox Mylanta Zantac  Pepcid  Insomnia: Benadryl (alcohol free) 25mg  every 6 hours as needed Tylenol PM Unisom, no Gelcaps  Leg Cramps: Tums MagGel  Nausea/Vomiting:  Bonine Dramamine Emetrol Ginger extract Sea bands Meclizine  Nausea medication to take during pregnancy:  Unisom (doxylamine succinate 25 mg tablets) Take one tablet daily at bedtime. If symptoms are not adequately controlled, the dose can be increased to a maximum recommended dose of two tablets daily (1/2 tablet in the morning, 1/2 tablet mid-afternoon and one at bedtime). Vitamin B6 100mg  tablets. Take one tablet twice a day (up to 200 mg per day).  Skin Rashes: Aveeno products Benadryl cream or 25mg  every 6 hours as needed Calamine Lotion 1% cortisone cream  Yeast infection: Gyne-lotrimin 7 Monistat 7  Gum/tooth pain: Anbesol  **If taking multiple medications, please check labels to avoid duplicating the same active ingredients **take  medication as directed on the label ** Do not exceed 4000 mg of tylenol in 24 hours **Do not take medications that contain aspirin or ibuprofen      [facility] for [employer] at [facility]  [address] [place], [place] [address]  [phone]  Center for [facility] Healthcare at [facility]  [address], [place], [place] [address]  [phone]  Center for [facility] Healthcare at [facility] [address], [place], [place] [address]  [phone]  Center for [facility] Healthcare at [facility] [address], [place], [place] [address] [phone]  Center for [facility] Healthcare at [facility]  [address] [place], [place] [address]  [phone]  Center for [facility] Healthcare at [facility]  [address] (First floor), [place], [place] [address]  [phone]  Center for [facility] Healthcare at [facility]  [address] [place], [place], [place] [address]  [phone]  [facility]  [address], [place], [place] [address]  [phone]  [facility]  [address] [place] [address] [place], [place], [place] [address]  [phone]  [facility]  [address] [place], [place] [address]  [phone]  [facility]  [address], [place], [place] [address]  [phone]  [facility]  [address], [place], [place] [address]  [phone]  [facility]   [address] [place], [place] [address]  [phone]  [facility]  [address], [place], [place] [address]   [phone]  [facility] [address], [place], [place] [address] [phone]  [facility]  [address], [place], [place] [address]  [phone]

## 2023-05-10 NOTE — MAU Note (Signed)
Pt says LMP 03-08-2023 Preg test positive yesterday .  Cramping started yesterday- after she slipped and fell- did not hit her head .  Landed on left side .  No VB  Cramping - now 7/10

## 2023-05-10 NOTE — MAU Provider Note (Signed)
History     CSN: [id number]  Arrival date and time: 05/10/23 1945   Event Date/Time   First Provider Initiated Contact with Patient 05/10/23 2200      Chief Complaint  Patient presents with   Abdominal Pain    Stacy Mcgee is a 28 y.o. G1P0 at [redacted]w[redacted]d by estimated LMP of 03/09/2024.  She presents today for abdominal cramping.  She reports left side cramping that started yesterday.  She states the pain is constant and "more like a muscle tightness than a cramp."  She states it has no relieving or worsening factors.  She does note it is "better when laying down."  She rates the pain a 7/10.   OB History     Gravida  1   Para      Term      Preterm      AB      Living         SAB      IAB      Ectopic      Multiple      Live Births              Past Medical History:  Diagnosis Date   Asthma    Eczema    Generalized headaches    Seasonal allergies     No past surgical history on file.  Family History  Problem Relation Age of Onset   Bipolar disorder Mother    Depression Mother    Migraines Father    ADD / ADHD Sister    Migraines Maternal Aunt    Cancer Maternal Grandfather    Heart Problems Paternal Grandfather     Social History   Tobacco Use   Smoking status: Every Day    Types: Cigarettes   Smokeless tobacco: Never   Tobacco comments:    4 cigarettes per day  Vaping Use   Vaping status: Never Used  Substance Use Topics   Alcohol use: Yes    Comment: occ   Drug use: Yes    Types: Marijuana    Allergies:  Allergies  Allergen Reactions   Peanut-Containing Drug Products Anaphylaxis    Medications Prior to Admission  Medication Sig Dispense Refill Last Dose/Taking   cephALEXin (KEFLEX) 500 MG capsule Take 1 capsule (500 mg total) by mouth 4 (four) times daily. 20 capsule 0    HYDROcodone-acetaminophen (NORCO/VICODIN) 5-325 MG tablet Take 1 tablet by mouth every 6 (six) hours as needed. 30 tablet 0    ibuprofen (ADVIL) 600  MG tablet Take 1 tablet (600 mg total) by mouth every 6 (six) hours as needed. 30 tablet 0    ibuprofen (ADVIL) 800 MG tablet Take 1 tablet (800 mg total) by mouth every 8 (eight) hours as needed (pain). 21 tablet 0    ibuprofen (ADVIL) 800 MG tablet Take 1 tablet (800 mg total) by mouth every 8 (eight) hours as needed. 30 tablet 0    methylPREDNISolone (MEDROL DOSEPAK) 4 MG TBPK tablet Take as package directed, taper dose 21 tablet 0    metroNIDAZOLE (FLAGYL) 500 MG tablet Take 1 tablet (500 mg total) by mouth 2 (two) times daily. 14 tablet 0    naproxen (NAPROSYN) 375 MG tablet Take 1 tablet (375 mg total) by mouth 2 (two) times daily with a meal. (Patient not taking: Reported on 10/07/2022) 30 tablet 0     Review of Systems  Gastrointestinal:  Positive for abdominal pain, nausea (Current nausea) and  vomiting. Negative for constipation and diarrhea.  Genitourinary:  Negative for difficulty urinating, dysuria, vaginal bleeding and vaginal discharge.   Physical Exam   Blood pressure 118/61, pulse 62, temperature 98 F (36.7 C), temperature source Oral, resp. rate 16, height 5\' 3"  (1.6 m), weight 97 kg, last menstrual period 03/10/2023.  Physical Exam Vitals reviewed.  Constitutional:      Appearance: Normal appearance. She is well-developed.  HENT:     Head: Normocephalic and atraumatic.  Eyes:     Conjunctiva/sclera: Conjunctivae normal.  Cardiovascular:     Rate and Rhythm: Normal rate.  Pulmonary:     Effort: Pulmonary effort is normal. No respiratory distress.  Musculoskeletal:        General: Normal range of motion.     Cervical back: Normal range of motion.  Skin:    General: Skin is warm and dry.  Neurological:     Mental Status: She is alert and oriented to person, place, and time.  Psychiatric:        Mood and Affect: Mood normal.        Behavior: Behavior normal.     MAU Course  Procedures Results for orders placed or performed during the hospital encounter of  05/10/23 (from the past 24 hours)  Urinalysis, Routine w reflex microscopic -Urine, Clean Catch     Status: Abnormal   Collection Time: 05/10/23  8:08 PM  Result Value Ref Range   Color, Urine YELLOW YELLOW   APPearance HAZY (A) CLEAR   Specific Gravity, Urine 1.023 1.005 - 1.030   pH 5.0 5.0 - 8.0   Glucose, UA NEGATIVE NEGATIVE mg/dL   Hgb urine dipstick NEGATIVE NEGATIVE   Bilirubin Urine NEGATIVE NEGATIVE   Ketones, ur 20 (A) NEGATIVE mg/dL   Protein, ur NEGATIVE NEGATIVE mg/dL   Nitrite NEGATIVE NEGATIVE   Leukocytes,Ua NEGATIVE NEGATIVE  Pregnancy, urine POC     Status: Abnormal   Collection Time: 05/10/23  8:14 PM  Result Value Ref Range   Preg Test, Ur POSITIVE (A) NEGATIVE  Wet prep, genital     Status: Abnormal   Collection Time: 05/10/23  8:19 PM   Specimen: Urine, Clean Catch  Result Value Ref Range   Yeast Wet Prep HPF POC NONE SEEN NONE SEEN   Trich, Wet Prep NONE SEEN NONE SEEN   Clue Cells Wet Prep HPF POC PRESENT (A) NONE SEEN   WBC, Wet Prep HPF POC <10 <10   Sperm NONE SEEN   ABO/Rh     Status: None   Collection Time: 05/10/23  8:43 PM  Result Value Ref Range   ABO/RH(D) B POS    No rh immune globuloin      NOT A RH IMMUNE GLOBULIN CANDIDATE, PT RH POSITIVE Performed at [facility], [address], [place], [place] [address]   CBC     Status: Abnormal   Collection Time: 05/10/23  8:46 PM  Result Value Ref Range   WBC 11.3 (H) 4.0 - 10.5 K/uL   RBC 4.32 3.87 - 5.11 MIL/uL   Hemoglobin 12.2 12.0 - 15.0 g/dL   HCT 60.4 54.0 - 98.1 %   MCV 85.9 80.0 - 100.0 fL   MCH 28.2 26.0 - 34.0 pg   MCHC 32.9 30.0 - 36.0 g/dL   RDW 19.1 47.8 - 29.5 %   Platelets 439 (H) 150 - 400 K/uL   nRBC 0.0 0.0 - 0.2 %  hCG, quantitative, pregnancy     Status: Abnormal  Collection Time: 05/10/23  8:46 PM  Result Value Ref Range   hCG, Beta Chain, Quant, S 27,245 (H) <5 mIU/mL  Comprehensive metabolic panel     Status: None   Collection Time: 05/10/23  8:46 PM   Result Value Ref Range   Sodium 136 135 - 145 mmol/L   Potassium 3.6 3.5 - 5.1 mmol/L   Chloride 102 98 - 111 mmol/L   CO2 23 22 - 32 mmol/L   Glucose, Bld 99 70 - 99 mg/dL   BUN 6 6 - 20 mg/dL   Creatinine, Ser 9.60 0.44 - 1.00 mg/dL   Calcium 9.2 8.9 - 45.4 mg/dL   Total Protein 7.0 6.5 - 8.1 g/dL   Albumin 3.7 3.5 - 5.0 g/dL   AST 15 15 - 41 U/L   ALT 12 0 - 44 U/L   Alkaline Phosphatase 48 38 - 126 U/L   Total Bilirubin 0.6 0.0 - 1.2 mg/dL   GFR, Estimated >09 >81 mL/min   Anion gap 11 5 - 15   US OB LESS THAN 14 WEEKS WITH OB TRANSVAGINAL Result Date: 05/10/2023 CLINICAL DATA:  Abdominal cramping EXAM: OBSTETRIC <14 WK [place] AND TRANSVAGINAL OB US TECHNIQUE: Both transabdominal and transvaginal ultrasound examinations were performed for complete evaluation of the gestation as well as the maternal uterus, adnexal regions, and pelvic cul-de-sac. Transvaginal technique was performed to assess early pregnancy. COMPARISON:  None Available. FINDINGS: Intrauterine gestational sac: Single Yolk sac:  Visualized. Embryo:  Visualized. Cardiac Activity: Visualized. Heart Rate: 102 bpm CRL:  3.8 mm   6 w   1 d                  [place] EDC: 01/02/2024 Subchorionic hemorrhage:  None visualized. Maternal uterus/adnexae: Right ovarian hemorrhagic cyst. Normal left ovary. No subchorionic hemorrhage. No free fluid in the pelvis. IMPRESSION: Single live intrauterine gestation.  No subchorionic hemorrhage. Electronically Signed   By: Annia Belt M.D.   On: 05/10/2023 21:53    MDM Physical Exam Cultures: Wet Prep and GC/CT Labs: UA, UPT, CBC, CMP, hCG Ultrasound Pain Medication Prescription Assessment and Plan  28 year old, G1P0  SIUP at 6.1 weeks Abdominal Cramping Nausea  -Orders placed in triage. -Results as above. -Reviewed findings with patient. -Patient offered and accepts pain medication. Tylenol and flexeril ordered.  -Discussed [place] and plan to repeat in 2 weeks to confirm viability  considering fetal bradycardia.  -Patient agreeable. Order placed. -Rx for Diclegis sent to pharmacy on file.  -Precautions reviewed. -List of community providers and pregnancy safe medications given.  -Encouraged to return to MAU if symptoms worsen or with the onset of new symptoms. -Discharged to home in stable condition.  Cherre Robins MSN, CNM Advanced Practice Provider, [facility] 05/10/2023, 10:00 PM

## 2023-05-12 ENCOUNTER — Other Ambulatory Visit: Payer: Self-pay

## 2023-05-12 ENCOUNTER — Encounter (HOSPITAL_COMMUNITY): Payer: Self-pay | Admitting: Obstetrics & Gynecology

## 2023-05-12 ENCOUNTER — Inpatient Hospital Stay (HOSPITAL_COMMUNITY): Payer: Medicaid Other

## 2023-05-12 ENCOUNTER — Inpatient Hospital Stay (HOSPITAL_COMMUNITY)
Admission: AD | Admit: 2023-05-12 | Discharge: 2023-05-12 | Disposition: A | Discharge status: Home / Self Care | Payer: Medicaid Other | Attending: Obstetrics & Gynecology | Admitting: Obstetrics & Gynecology

## 2023-05-12 DIAGNOSIS — R109 Unspecified abdominal pain: Secondary | ICD-10-CM | POA: Insufficient documentation

## 2023-05-12 DIAGNOSIS — O208 Other hemorrhage in early pregnancy: Secondary | ICD-10-CM | POA: Diagnosis present

## 2023-05-12 DIAGNOSIS — B9689 Other specified bacterial agents as the cause of diseases classified elsewhere: Secondary | ICD-10-CM

## 2023-05-12 DIAGNOSIS — O200 Threatened abortion: Secondary | ICD-10-CM

## 2023-05-12 DIAGNOSIS — O26851 Spotting complicating pregnancy, first trimester: Secondary | ICD-10-CM

## 2023-05-12 DIAGNOSIS — O468X1 Other antepartum hemorrhage, first trimester: Secondary | ICD-10-CM

## 2023-05-12 DIAGNOSIS — O26891 Other specified pregnancy related conditions, first trimester: Secondary | ICD-10-CM

## 2023-05-12 DIAGNOSIS — Z3490 Encounter for supervision of normal pregnancy, unspecified, unspecified trimester: Secondary | ICD-10-CM

## 2023-05-12 DIAGNOSIS — Z3A01 Less than 8 weeks gestation of pregnancy: Secondary | ICD-10-CM | POA: Insufficient documentation

## 2023-05-12 DIAGNOSIS — O26899 Other specified pregnancy related conditions, unspecified trimester: Secondary | ICD-10-CM | POA: Diagnosis not present

## 2023-05-12 LAB — URINALYSIS, ROUTINE W REFLEX MICROSCOPIC
Bacteria, UA: NONE SEEN
Bilirubin Urine: NEGATIVE
Glucose, UA: NEGATIVE mg/dL
Ketones, ur: 20 mg/dL — AB
Leukocytes,Ua: NEGATIVE
Nitrite: NEGATIVE
Protein, ur: NEGATIVE mg/dL
Specific Gravity, Urine: 1.021 (ref 1.005–1.030)
pH: 6 (ref 5.0–8.0)

## 2023-05-12 NOTE — MAU Note (Signed)
.  Stacy Mcgee is a 28 y.o. at [redacted]w[redacted]d here in MAU reporting: at 0400 this morning she noticed some bright red spotting.  Reports shortly after that she started having abd pain that radiates from her mid stomach to her left flank.  Was diagnosed with BV a few days ago, states she has not picked up meds for it yet.  Denies recent intercourse.     Onset of complaint: 0400 Pain score: 7 Vitals:   05/12/23 0931  BP: 125/73  Pulse: 73  Resp: 15  Temp: 97.7 F (36.5 C)  SpO2: 100%      Lab orders placed from triage: ua

## 2023-05-12 NOTE — Discharge Instructions (Signed)
 Return to MAU: If you have heavier bleeding that soaks through more that 2 pads per hour for an hour or more If you bleed so much that you feel like you might pass out or you do pass out If you have significant abdominal pain that is not improved with Tylenol 1000 mg every 8 hours as needed for pain If you develop a fever > 100.5

## 2023-05-12 NOTE — MAU Provider Note (Signed)
History     CSN: [id number]  Arrival date and time: 05/12/23 5366   Event Date/Time   First Provider Initiated Contact with Patient 05/12/23 [phone]      Chief Complaint  Patient presents with  . Abdominal Pain  . Vaginal Bleeding   HPI [name] is a 28 y.o. year old G1P0 female at [redacted]w[redacted]d weeks gestation who presents to MAU reporting ***.   OB History     Gravida  1   Para      Term      Preterm      AB      Living         SAB      IAB      Ectopic      Multiple      Live Births              Past Medical History:  Diagnosis Date  . Asthma   . Eczema   . Generalized headaches   . Seasonal allergies     History reviewed. No pertinent surgical history.  Family History  Problem Relation Age of Onset  . Bipolar disorder Mother   . Depression Mother   . Migraines Father   . ADD / ADHD Sister   . Migraines Maternal Aunt   . Cancer Maternal Grandfather   . Heart Problems Paternal Grandfather     Social History   Tobacco Use  . Smoking status: Every Day    Types: Cigarettes  . Smokeless tobacco: Never  . Tobacco comments:    4 cigarettes per day  Vaping Use  . Vaping status: Never Used  Substance Use Topics  . Alcohol use: Yes    Comment: occ  . Drug use: Yes    Types: Marijuana    Allergies:  Allergies  Allergen Reactions  . Peanut-Containing Drug Products Anaphylaxis    Medications Prior to Admission  Medication Sig Dispense Refill Last Dose/Taking  . Doxylamine-Pyridoxine 10-10 MG TBEC Take 2 tablets by mouth at bedtime. 60 tablet 2   . metroNIDAZOLE (METROGEL) 0.75 % vaginal gel Place 1 Applicatorful vaginally at bedtime. Apply one applicatorful to vagina at bedtime for 5 days 70 g 1     Review of Systems Physical Exam   Blood pressure 125/73, pulse 73, temperature 97.7 F (36.5 C), temperature source Oral, resp. rate 15, last menstrual period 03/10/2023, SpO2 100%.  Physical Exam  MAU Course   Procedures  MDM OB TVUS  US OB Transvaginal Result Date: 05/12/2023 CLINICAL DATA:  spotting and abdominal pain in 1st trimester. EXAM: TRANSVAGINAL OB ULTRASOUND TECHNIQUE: Transvaginal ultrasound was performed for complete evaluation of the gestation as well as the maternal uterus, adnexal regions, and pelvic cul-de-sac. COMPARISON:  None Available. FINDINGS: Intrauterine gestational sac: Single Yolk sac:  Visualized. Embryo:  Visualized. Cardiac Activity: Visualized. Heart Rate: 117 bpm CRL:   3.7 mm   6 w 0 d                  [place] EDC: 01/05/2024. Subchorionic hemorrhage: Note is made of small subchorionic hemorrhage measuring 4 x 9 x 20 mm, occupying less than 10% of the circumference of the gestational sac. Maternal uterus/adnexae: Bilateral ovaries are within normal limits. No focal uterine mass seen. IMPRESSION: *Single live intrauterine gestation with crown-rump length corresponding to 6 weeks 0 day. *Small subchorionic hemorrhage occupying less than 10% of the circumference of the gestational sac. Electronically Signed   By: Rhea Belton  Ramiro Harvest M.D.   On: 05/12/2023 10:53    Assessment and Plan  ***  Raelyn Mora, CNM 05/12/2023, 9:43 AM

## 2023-05-13 LAB — GC/CHLAMYDIA PROBE AMP (~~LOC~~) NOT AT ARMC
Chlamydia: NEGATIVE
Comment: NEGATIVE
Comment: NORMAL
Neisseria Gonorrhea: NEGATIVE

## 2023-07-07 ENCOUNTER — Encounter (HOSPITAL_COMMUNITY): Payer: Self-pay | Admitting: Obstetrics & Gynecology

## 2023-07-07 ENCOUNTER — Inpatient Hospital Stay (HOSPITAL_COMMUNITY)
Admission: AD | Admit: 2023-07-07 | Discharge: 2023-07-07 | Disposition: A | Discharge status: Home / Self Care | Attending: Obstetrics & Gynecology | Admitting: Obstetrics & Gynecology

## 2023-07-07 ENCOUNTER — Other Ambulatory Visit: Payer: Self-pay

## 2023-07-07 DIAGNOSIS — N888 Other specified noninflammatory disorders of cervix uteri: Secondary | ICD-10-CM | POA: Diagnosis not present

## 2023-07-07 DIAGNOSIS — Z3A14 14 weeks gestation of pregnancy: Secondary | ICD-10-CM | POA: Insufficient documentation

## 2023-07-07 DIAGNOSIS — O99332 Smoking (tobacco) complicating pregnancy, second trimester: Secondary | ICD-10-CM | POA: Insufficient documentation

## 2023-07-07 DIAGNOSIS — N760 Acute vaginitis: Secondary | ICD-10-CM

## 2023-07-07 DIAGNOSIS — O26892 Other specified pregnancy related conditions, second trimester: Secondary | ICD-10-CM | POA: Diagnosis not present

## 2023-07-07 DIAGNOSIS — B9689 Other specified bacterial agents as the cause of diseases classified elsewhere: Secondary | ICD-10-CM | POA: Diagnosis not present

## 2023-07-07 DIAGNOSIS — O23592 Infection of other part of genital tract in pregnancy, second trimester: Secondary | ICD-10-CM | POA: Insufficient documentation

## 2023-07-07 DIAGNOSIS — O209 Hemorrhage in early pregnancy, unspecified: Secondary | ICD-10-CM | POA: Diagnosis not present

## 2023-07-07 DIAGNOSIS — F17210 Nicotine dependence, cigarettes, uncomplicated: Secondary | ICD-10-CM | POA: Diagnosis not present

## 2023-07-07 LAB — URINALYSIS, ROUTINE W REFLEX MICROSCOPIC
Bilirubin Urine: NEGATIVE
Glucose, UA: NEGATIVE mg/dL
Hgb urine dipstick: NEGATIVE
Ketones, ur: 20 mg/dL — AB
Leukocytes,Ua: NEGATIVE
Nitrite: NEGATIVE
Protein, ur: NEGATIVE mg/dL
Specific Gravity, Urine: 1.024 (ref 1.005–1.030)
pH: 5 (ref 5.0–8.0)

## 2023-07-07 LAB — WET PREP, GENITAL
Sperm: NONE SEEN
Trich, Wet Prep: NONE SEEN
WBC, Wet Prep HPF POC: <10 (ref <10)
Yeast Wet Prep HPF POC: NONE SEEN

## 2023-07-07 MED ORDER — METRONIDAZOLE 500 MG PO TABS
500.0000 mg | ORAL_TABLET | Freq: Two times a day (BID) | ORAL | 0 refills | Status: AC
Start: 2023-07-07 — End: 2023-07-14

## 2023-07-07 MED ORDER — ACETAMINOPHEN 500 MG PO TABS
1000.0000 mg | ORAL_TABLET | Freq: Once | ORAL | Status: AC
  Administered 2023-07-07: 1000 mg via ORAL
  Filled 2023-07-07: qty 2

## 2023-07-07 NOTE — MAU Note (Signed)
 RN contacted [facility] for a bus pass. [facility] directed RN to contact social work. Social work unavailable at this time, Scientist, research (medical) with call back information.

## 2023-07-07 NOTE — MAU Provider Note (Signed)
 History     CSN: [id number]  Arrival date and time: 07/07/23 1523   Event Date/Time   First Provider Initiated Contact with Patient 07/07/23 1622      No chief complaint on file.  HPI  Stacy Mcgee is a 28 y.o. female G1P0 @ [redacted]w[redacted]d  here with complaints of vaginal bleeding. The bleeding started after intercourse yesterday. She reports using the bathroom and seeing 2 clots in the toilet. She does not have any further bleeding. Reports some lower abdominal cramping that comes and goes. The pain also started after intercourse.   OB History     Gravida  1   Para      Term      Preterm      AB      Living         SAB      IAB      Ectopic      Multiple      Live Births              Past Medical History:  Diagnosis Date   Asthma    Eczema    Generalized headaches    Seasonal allergies     Past Surgical History:  Procedure Laterality Date   HAND SURGERY      Family History  Problem Relation Age of Onset   Bipolar disorder Mother    Depression Mother    Migraines Father    ADD / ADHD Sister    Migraines Maternal Aunt    Cancer Maternal Grandfather    Heart Problems Paternal Grandfather     Social History   Tobacco Use   Smoking status: Every Day    Types: Cigarettes   Smokeless tobacco: Never   Tobacco comments:    4 cigarettes per day  Vaping Use   Vaping status: Never Used  Substance Use Topics   Alcohol use: Yes    Comment: occ   Drug use: Yes    Types: Marijuana    Allergies:  Allergies  Allergen Reactions   Peanut-Containing Drug Products Anaphylaxis    Medications Prior to Admission  Medication Sig Dispense Refill Last Dose/Taking   metroNIDAZOLE (METROGEL) 0.75 % vaginal gel Place 1 Applicatorful vaginally at bedtime. Apply one applicatorful to vagina at bedtime for 5 days 70 g 1 Past Week   Doxylamine-Pyridoxine 10-10 MG TBEC Take 2 tablets by mouth at bedtime. 60 tablet 2    No results found for this or any  previous visit (from the past 48 hours).   Review of Systems  Constitutional:  Negative for fever.  Gastrointestinal:  Positive for abdominal pain.  Genitourinary:  Positive for vaginal bleeding and vaginal discharge.   Physical Exam   Blood pressure 122/74, pulse 67, temperature 98.1 F (36.7 C), temperature source Oral, resp. rate 16, last menstrual period 03/10/2023, SpO2 100%.  Physical Exam Constitutional:      General: She is not in acute distress.    Appearance: Normal appearance. She is not ill-appearing, toxic-appearing or diaphoretic.  Abdominal:     Palpations: Abdomen is soft.     Tenderness: There is no abdominal tenderness.  Genitourinary:    Comments: Vagina - Small amount of pink vaginal discharge, no odor  Cervix - No contact bleeding, no active bleeding  Bimanual exam: Cervix closed, thick, posterior  GC/Chlam, wet prep done Chaperone present for exam.   Neurological:     Mental Status: She is alert and oriented to person,  place, and time.  Psychiatric:        Behavior: Behavior normal.    MAU Course  Procedures  MDM  B positive type  GC and Wet prep collected  Tylenol 1 gram given. Pain significantly better 7>3 Assessment and Plan   A:  1. Friable cervix   2. [redacted] weeks gestation of pregnancy   3. Bacterial vaginosis      P:  Dc home Pelvic rest Return to MAU if symptoms worsen Rx: Flagyl     Almond Jaffe 07/07/2023, 4:22 PM

## 2023-07-07 NOTE — MAU Note (Signed)
.  Stacy Mcgee is a 28 y.o. at [redacted]w[redacted]d here in MAU reporting: lower back pain for the past 3 days that is worse with movement.  Pt also reports seeing a few drops of blood while using restroom today.  Last intercourse was last night.  Pt also asking for medication for yeast infection.  Onset of complaint: 3 days Pain score: 6 Vitals:   07/07/23 1548  BP: 116/62  Pulse: 74  Resp: 16  Temp: 98.1 F (36.7 C)  SpO2: 100%      Lab orders placed from triage: ua

## 2023-07-08 LAB — GC/CHLAMYDIA PROBE AMP (~~LOC~~) NOT AT ARMC
Chlamydia: NEGATIVE
Comment: NEGATIVE
Comment: NORMAL
Neisseria Gonorrhea: NEGATIVE

## 2023-07-09 ENCOUNTER — Telehealth

## 2023-07-09 DIAGNOSIS — Z3A14 14 weeks gestation of pregnancy: Secondary | ICD-10-CM | POA: Diagnosis not present

## 2023-07-09 DIAGNOSIS — Z3492 Encounter for supervision of normal pregnancy, unspecified, second trimester: Secondary | ICD-10-CM | POA: Diagnosis not present

## 2023-07-09 DIAGNOSIS — Z3490 Encounter for supervision of normal pregnancy, unspecified, unspecified trimester: Secondary | ICD-10-CM | POA: Insufficient documentation

## 2023-07-09 MED ORDER — PRENATAL 27-1 MG PO TABS: 1.0000 | ORAL_TABLET | Freq: Every day | ORAL | 11 refills | Status: DC

## 2023-07-09 MED ORDER — BLOOD PRESSURE KIT DEVI: 1.0000 | 0 refills | Status: DC | PRN

## 2023-07-09 MED ORDER — BLOOD PRESSURE KIT DEVI
1.0000 | 0 refills | Status: AC | PRN
Start: 2023-07-09 — End: ?

## 2023-07-09 NOTE — Patient Instructions (Signed)
 Safe Medications in Pregnancy   Acne:  Benzoyl Peroxide  Salicylic Acid   Backache/Headache:  Tylenol: 2 regular strength every 4 hours OR               2 Extra strength every 6 hours   Colds/Coughs/Allergies:  Benadryl (alcohol free) 25 mg every 6 hours as needed  Breath right strips  Claritin  Cepacol throat lozenges  Chloraseptic throat spray  Cold-Eeze- up to three times per day  Cough drops, alcohol free  Flonase (by prescription only)  Guaifenesin  Mucinex  Robitussin DM (plain only, alcohol free)  Saline nasal spray/drops  Sudafed (pseudoephedrine) & Actifed * use only after [redacted] weeks gestation and if you do not have high blood pressure  Tylenol  Vicks Vaporub  Zinc lozenges  Zyrtec   Constipation:  Colace  Ducolax suppositories  Fleet enema  Glycerin suppositories  Metamucil  Milk of magnesia  Miralax  Senokot  Smooth move tea   Diarrhea:  Kaopectate  Imodium A-D   *NO pepto Bismol   Hemorrhoids:  Anusol  Anusol HC  Preparation H  Tucks   Indigestion:  Tums  Maalox  Mylanta  Zantac  Pepcid   Insomnia:  Benadryl (alcohol free) 25mg  every 6 hours as needed  Tylenol PM  Unisom, no Gelcaps   Leg Cramps:  Tums  MagGel   Nausea/Vomiting:  Bonine  Dramamine  Emetrol  Ginger extract  Sea bands  Meclizine  Nausea medication to take during pregnancy:  Unisom (doxylamine succinate 25 mg tablets) Take one tablet daily at bedtime. If symptoms are not adequately controlled, the dose can be increased to a maximum recommended dose of two tablets daily (1/2 tablet in the morning, 1/2 tablet mid-afternoon and one at bedtime).  Vitamin B6 100mg  tablets. Take one tablet twice a day (up to 200 mg per day).   Skin Rashes:  Aveeno products  Benadryl cream or 25mg  every 6 hours as needed  Calamine Lotion  1% cortisone cream   Yeast infection:  Gyne-lotrimin 7  Monistat 7    **If taking multiple medications, please check labels to avoid  duplicating the same active ingredients  **take medication as directed on the label  ** Do not exceed 4000 mg of tylenol in 24 hours  **Do not take medications that contain aspirin or ibuprofen            [facility] Pediatric Providers  [place] ([address]) [facility] Manson Passey, MD; Deirdre Priest, MD; Lum Babe, MD; Leveda Anna, MD; McDiarmid, MD; Jerene Bears, MD [address] [place] [place], [place] [address] [phone] Mon-Fri 8:30-12:30, 1:30-5:00  Providers come to see babies during newborn hospitalization Only accepting infants of Mother's who are seen at [facility] or have siblings seen at   [facility] Medicaid - Yes; Tricare - Yes   [facility], MD [address] [place], [place] [address] [phone] Mon, Tue, Thur, Fri 8:30-5:00, Wed 10:00-7:00 (closed 1-2pm daily for lunch) [facility] residents with no insurance.  [place] [employer] only with Medicaid/insurance; Tricare - no  [facility]) - Tim and [facility], MD; Manson Passey, MD; Ave Filter, MD; Luna Fuse, MD; Kennedy Bucker, MD; Florestine Avers, MD; Melchor Amour, MD; Yetta Barre,  MD; Konrad Dolores, MD; Kathlene November, MD; Jenne Campus, MD; Wynetta Emery, MD; Duffy Rhody, MD; Gerre Couch, NP [address] [place], [place], [place] [address] [phone] Mon, Tue, Thur, Fri 8:30-5:30, Wed 9:30-5:30, Sat 8:30-12:30 Only accepting infants of first-time parents or siblings of current  patients Hospital discharge coordinator will make follow-up appointment Medicaid - yes; Tricare - yes  [place] ([address]) [facility], MD; Earlene Plater, MD; Jamesetta Orleans, MD; Alvera Novel, MD; Rana Snare, MD; [facility], MD; De Graff, MD; Hosie Poisson, MD; Mayford Knife, MD [address], [place], [place] [address] [phone] Mon-Fri 8:30-5:00, closed for lunch 12:30-1:30; Sat-Sun 10:00-1:00 Accepting Newborns with commercial insurance only, must call prior to  delivery to be accepted into  practice.  Medicaid - no, Tricare - yes   [facility] [address] [place], [place] [address] [phone] or [phone] Mon to Fri 8am to 10pm, Sat 8am to 1pm (virtual only on weekends) Only accepts Medicaid Healthy Blue pts  [facility] ([facility]) - Pediatrics at Elige Radon, MD; Sabino Dick, MD; Quitman Livings, MD; Betha Loa, NP; Claretha Cooper, MD; Lelon Perla, MD [address] [place] [place], [place] [address] [phone] Mon-Fri 8:30-5:30 Medicaid - yes, Tricare - yes  [place] [phone]) [facility] Mowers, MD [address], [place], [place] [address] [phone] Iona Hansen, Wed Fri 8:30-5:00, Sat 8:30-12:00, Closed Thursdays Accepting siblings of established patients and first time mom's if you call prenatally Medicaid- yes; Tricare - yes  [facility] Lutricia Feil, [place]; Tracie Harrier, MD; Rusty Aus; Scifres, PA; Wynelle Link, MD; Azucena Cecil, MD;  [address] [place], [place] [address] [phone] Mon-Fri 8:30-5:00, closed for lunch 1-2 Only accepting newborns of established patients Medicaid- no; Tricare - yes  [facility] [phone]) [facility] Morene Crocker, MD; [address], [place], [place] [address] [phone] Mon-Fri 8:00-5:00 Medicaid - No; Tricare - Yes  [facility] at [facility], [place]; [place], [place] [address], [place], [place] [address] [phone] Mon-Fri 8:00-5:00 Medicaid - No, Tricare - Yes  [facility] Cardell Peach, MD; Nash Dimmer, MD; [place], [place] [address] [place], [place] [address] [phone]  Mon-Fri 8:00-5:00 Medicaid - No; Tricare - Yes  [facility] [address], [place], [place] [address] [phone] Mon-Fri 8:30-5:00 (lunch 12:00-1:00) Medicaid -Yes; Tricare - Yes  [facility], MD [address] [place],  [place], [place] [address] [phone] Mon-Fri 8:00-5:00 Seeing newborns of current patients only. No new patients Medicaid - No, Tricare - yes  Nature conservation officer at [place] [address] MD [address] [place], [place] [address] [phone] Mon-Fri 8:00-5:00 Medicaid -yes as secondary coverage only; Tricare - yes  [facility] [place], [place]; [place], [place]; Avis Epley, MD; Vonna Kotyk, MD; Clance Boll, MD; [place], [place]; [place], NP; Vaughan Basta, MD; [place], MD [address] [place], [place] [address] [phone] Mon-Fri 8:30-5:00, Sat 9:00-11:00 Accepts commercial insurance ONLY. Offers free prenatal information sessions for families. Medicaid - No, Tricare - Call first  [facility] [place], MD; [place], [place]; [place], [place]; [place], [place] [address] [place] [address] [phone] Mon-Fri 7:30-5:30 Medicaid - Yes; Ailene Rud yes  [place] [phone] & [phone])  [facility], MD [address] [place], [place] [address] [phone] Mon-Thur 8:00-6:00, closed for lunch 12-2, closed Fridays Medicaid - yes; Tricare - no  [facility] Dareen Piano, NP; Cyndia Bent, MD; [place], [place]; [place], [place] [address], [place], [place] [address] [phone] Mon-Fri 7:30-4:30 Medicaid - yes, Tricare - yes  [facility]  Juanito Doom, MD; Janene Harvey, NP; Vonita Moss, MD; Donn Pierini, NP [address], [place], [place] [address] [phone] Mon-Fri 8:30-5:00, closed for lunch 1-2, Sat 8:30-12:00 - sick visits only Providers come to see  babies at [facility] Only accepting newborns of siblings and first time parents ONLY if who have met with office prior to delivery Medicaid -Yes; Tricare - yes  [facility] - [place], [place]; Spero Geralds, NP; Earlene Plater, MD; Lucretia Roers, MD:  [address], [place], [place] [address] [phone] Mon- Fri 8:00-5:00, Sat 9:00-12:00 - sick  visits only Accepting siblings of established patients and first time mom/baby Medicaid - Yes; Tricare - yes Patients must have vaccinations (baby vaccines)  [place] [phone] & [phone])  Adult nurse HealthCare at [facility] [address] [place], [place] [address] [phone] Mon-Fri 8:00-5:00 Medicaid - no; Tricare - yes  [facility] [place], MD; [place], [place]; [place], [place] [address], [place], [place] [address] [phone] Mon-Fri 8:00-5:00 Medicaid- yes; Tricare - yes  [facility] - Ardeen Jourdain, MD; Yetta Barre, NP; [place], [place] [address] [place], [place], [place] [address] [phone] Mon-Fri 8:00-5:00 Medicaid - Yes; Tricare - yes  [address] [place] [phone])  [facility] [place], [place]; [place], [place]; Eddie Candle, MD; Normand Sloop, MD; [place], NP; [place], DO; [place], [place]; Constance Goltz, MD; Ruthann Cancer, MD; Vear Clock, MD; [place], [place]; [place], [place]; [place], [place] [address] [address] [facility] [address], [place], [place] [address] [phone] Mon-Fri 8:30-5:00, Sat 9:00-12:00 - sick only Please register online [email or web address] then schedule online or call office Medicaid-Yes; Tricare -yes  [facility] - [place]  [place], MD; Romualdo Bolk, MD; Kirby, MD; [place], NP; [place], [place]; Antonietta Barcelona, MD; Mayford Knife, NP; Shelva Majestic, MD [address], [place], [place] [address] [phone] Mon-Fri 8:00-5:30, Sat&Sun by appointment (phones open at 8:30) Medicaid - Yes; Tricare - yes  [place] [phone] & [phone]) [facility] Pediatrics Mariel Aloe; [place], MD; Roger Shelter, MD; [name] Market, NP; [place], DO [address], [place], [place] [address] [phone] M-F 8:00 - 5:15, Sat/Sun 9-12 sick visits only Medicaid - No; Tricare - yes  [facility]  [place], PA-C; [place], PA-C; [place], DO; [place], PA-C; [place], PA-C; Roselyn Bering, MD [address] [place], [place] [address] [phone] Mon-Thur 8:00-7:00, Fri 8:00-5:00 Accepting Medicaid for 13 and under only   [facility] - Family Medicine at [place] [facility] [place]) [place], [place]; List, FNP; Berneda Rose, MD; Luther Redo, PA-C; Lavonia Drafts, MD; Kellie Simmering, FNP; Genevie Cheshire, FNP; Evaristo Bury, MD; Berneda Rose, MD [phone] [address] [place], [place] [address] [phone] Mon-Fri 8:30-5:30 Medicaid - Yes; Tricare - yes  [facility] - [address]  [place], [place]; Whitney Post, MD; Hennie Duos, MD; Wynne Dust, MD; [place], NP [address], [place], [place] [address] [phone] Mon-Thur 8:00-5:30, Fri 8:00-5:00, Sat 9:00-12:00 Medicaid - yes, Tricare - yes  [place] [phone])  [facility], [place]; Lenise Arena, MD; [place], [place] [address], [place], [place] [address] [phone] Mon-Fri 8:00-5:00, closed for lunch 12-1 Medicaid - No; Tricare - yes  Nature conservation officer at [facility], MD [address] [place], [place] [address] [phone] Mon-Fri 8:00-5:00 Medicaid - No; Tricare - yes  [facility], MD; Tami Ribas, MD; Mariam Dollar, MD; Yetta Barre, MD [address], [place], [place] [address] [phone] Mon-Fri 8:00-5:00 Medicaid- Yes; Tricare - yes  [place] [phone])  Adult nurse HealthCare at [facility], [place]; [place], MD [address] [place], [place], [place] [address] [phone] Mon-Fri 8:00-5:00 Medicaid - No; Tricare - yes  [facility] - CPNP [address] [place], [place] [address] [phone] Mon-Weds 8:00-6:00, Thurs-Fri 8:00-5:00, Sat 9:00-12:00 Medicaid - yes; Tricare - yes   [facility] Katharina Caper, MD; [place], [place] [address] [place], [place] [address] [phone] Mon-Fri 8:00-5:00 Medicaid - yes; Tricare - yes  [employer]  Pediatric Providers  [facility] [address], [place], [place] [address] [phone] Sheral Flow: 8am -8pm, Tues, Weds: 8am - 5pm; Fri: 8-1 Medicaid - Yes; Tricare - yes  [facility] Rachel Bo, MD; Laural Benes, MD; Anner Crete, MD; [place], [place]; [place], [place] [address], [place], [place] [address] [phone] M-F 8:30 - 5:00 Medicaid - Call office; Tricare -yes  [facility] Edson Snowball, MD; Shanon Rosser, MD, Chelsea Primus, MD; Shirlyn Goltz, PNP; Wardell Heath, NP [phone] [address], [place], [place] [address] [phone] M-F 8:30 - 5:00, Sat/Sun 8:30 - 12:30 (sick visits) Medicaid - Call office; Tricare -yes  [facility] Melvyn Neth, MD; Karl Luke, PNP; Princess Bruins, MD; [place], [place]; [place], NP; Cynda Familia [address], [place], [place] [address] [phone] M-F 8:30 - 5:00 Medicaid - Call office; Tricare - yes  [facility] Jesusita Oka, MD; Dierdre Highman, MD; Earnest Conroy, MD; Timothy Lasso, MD; [place], MD [phone] [address] [place], [place] [address] [phone] M-Thur: 8:00 - 5:00; Fri: 8:00 - 4:00 Medicaid - yes; Tricare - yes  [facility] [address] [place], [place] [address] [phone] M-F: 8:30- 5:00, closed for lunch 12:30 - 1:00 Medicaid - yes; Tricare -yes  [facility] [address] [place], [place] [address] [phone] M-F 8:00 - 5:00 Medicaid - yes; Tricare - yes  [facility] [place], DO; [place], DO; [place], NP [address], [place], [place] [address] [phone] M-F 8:00 - 5:00, Closed 12-1 for lunch Medicaid - Call; Tricare - yes  [facility] Meredith Mody, MD [address], [place], [place] [address] [phone] M-F: 8:00-5:00, Sat: 8:00 - noon Medicaid - call; Tricare -yes  [facility] Pediatric Providers  [facility] [place], [place] [address] [place], [place], [place] [address] [phone] M-W: 8:00-5:00, Thur: 8:00 -  7:00, Fri: 8:00 - noon Medicaid - yes; Tricare - yes  [facility] - Quay Burow, FNP [address], [place], [place] [address] [phone] M-F 8:00 - 5:00, Closed for lunch 12-1 Medicaid - yes; Tricare - yes  [facility] Pediatric Providers  [facility] Primary Care at [place], [place], Alinda Money, MD, [place], FNP-C [address], [facility], [address], [place], [place] [address] [phone] M-T 8:00-5:00, Wed-Fri 7:00-6:00 Medicaid - Yes; Tricare -yes  [facility] at [facility], DO; [address] Saner [place], [place] [address] [phone] M-F 8:00 - 5:00, closed for lunch 12-1 Medicaid - Yes; Tricare - yes  [facility] Pediatrics and Internal Medicine  Zachery Dauer, MD; Gladstone Lighter, MD; Collie Siad, MD; Freda Jackson, MD; Rich Number, MD; Darryl Nestle, MD; Melinda Crutch, MD, Audria Nine, MD; Tawanna Cooler, MD; Steffanie Dunn, MD; Byrd Hesselbach, MD; Lucretia Roers, MD [address] [place], [place] [address] [phone] M-F 8:00-5:00 Medicaid - yes; Tricare - yes  [facility] [place], MD (speaks [place] and Hindi) [address] [place], [place] [address] [phone] M-F: 8:30 - 5:00, closed 12:30 - 1 for lunch Medicaid - Yes; Tricare -yes  [facility] Pediatric Providers  [facility] Shanda Bumps, MD; Chanetta Marshall, MD; Laurell Josephs,  MD [address] [place], [place] [address] [phone] M-Th: 8:00 - 5:30, Fri: 8:00 - 12:00 Medicaid - yes; Tricare - yes  [facility] Pediatrics at [facility], NP; Thora Lance, MD; Orrin Brigham, MD [phone] [address] [place], [place] [address] [phone] M-F: 8:00 - 5:00 Medicaid - yes; Tricare - yes  [facility] [place], NP; Orson Slick, NP; Salley Scarlet, NP; Linton Flemings, MD; Mayford Knife, MD, [place], NP, Emelda Fear, MD; Nida Boatman [address], [place], [place] [address] [phone] M-F: 8:30 - 5:30p Medicaid - yes; Tricare - yes Other locations available as well  [facility], MD; Andrey Campanile, MD; Neville Route, PA-C [address], [place], [place] [address] [phone] M-W: 8:00am - 7:00pm, Thurs: 8:00am - 8:00pm; Fri: 8:00am - 5:00pm, closed daily from 12-1 for lunch Medicaid - yes; Tricare - yes  [facility] Pediatric Providers  [facility] at Levin Erp, MD; Aggie Cosier, FNP; Bland Span, MD; Tristan Schroeder, MD; [place], PNP; Alesia Banda; [place], [place]; Julian Reil, MD;  [address], [place], [place] [address] [phone] Judie Petit - Caleen Essex: 8am - 5pm, Sat 9-noon Medicaid - Yes; Tricare -yes  [facility] at Jaclynn Guarneri, MD; Yetta Barre, FNP; Lilian Kapur, MD; Mariam Dollar, MD [address] [place], [place] [phone] M-F 8:00 - 5:00 Medicaid - call; Tricare - yes  [facility]- Cruz Condon, MD; [place], [place]; Delora Fuel, MD; Dareen Piano, MD; Trudee Grip, MD; Kizzie Ide, MD; Zebedee Iba; Birdena Crandall, MD; Hinton Dyer, MD; [place], MD [address], [place], [place] [address] [phone] M-F 8:00am - 5:00pm; Sat. 9:00 - 11:00 Medicaid - yes; Tricare - yes  [facility] at [facility], MD [address] [place], [place] [address] [phone] M-F 8:00 - 5:00 Medicaid - [place] Medicaid only; Tricare - yes  [facility] - Illene Bolus, MD; Earlene Plater, [place]; Kenyon Ana, MD [address], [place], [place] [address] [phone] M-F 8:00 - 5:00 Medicaid - yes; Tricare - yes  [facility] [address] Pediatrics - Lind Covert, MD; Manson Passey, MD, [facility], MD, [place], MD; [place], MD; Katrinka Blazing, MD; [address] [place], [place] [address] [phone] M-F: 8-5 Medicaid - yes; Tricare - yes  [facility] - Henrietta Hoover, [place]; [place], MD; [address] [place], [place] [address] [phone] M-F 8-5 Medicaid - yes; Tricare - yes  [address] [place] Darrol Poke, MD; Tami Ribas, MD; [place], MD; [place], DNP;  [place], PNP [address], [place], [place] [address] [phone] M-F 8-5 Medicaid - yes; Tricare - yes  [facility] Internal [facility] and Pediatrics Delories Heinz, MD; Adrienne Mocha; Ala Bent, MD [address] [place], [place] [address] [phone] M-F 7am - 5 pm Medicaid - call; Tricare - yes  [facility] [place], [place]; Fredia Beets, MD; Roxan Hockey, MD [address] [place], [place] [address] [phone] M-F 8-5 Medicaid - yes; Tricare - yes  [facility] Kae Heller, MD; Sheliah Hatch, MD; Mayford Knife, FNP; Shon Baton, FNP; Tyron Russell, FNP; Ishmael Holter; [facility] - FNP [address] [place], [place] [address] [phone] M-F 8-5 Medicaid- yes; Tricare - yes  [facility] - Betsy Coder, [place] and [place] Guest, MD; Terrial Rhodes, MD; Hulda Humphrey, MD; Roseanne [place], MD; [place], [place]; Ala Dach, MD; Fredia Beets, MD; Dimple Casey, MD [address], [place], [place] [address] [phone] M-F: 8-5, Sat: 9-4, Sun 9-12 Medicaid - yes; Tricare - yes  [facility] [place], PNP; [place], PNP [address] [place],  [place] [address] [phone] M-F 8 - 5, closed 12-1 for lunch Medicaid - yes; Tricare - yes  [facility] Kathyrn Lass, MD; Hal Neer, MD; Dimple Casey, MD; [place], DO [address] [place], [place] [address] [phone] M-F 8- 5:30 Medicaid - yes; Tricare - yes  [facility] - Biagio Quint, MD; Rosalia Hammers, MD; Gwenith Daily, MD [address] [place], [place] [address] [phone] Judie Petit: Nicholas Lose; Tues-Fri: 8-5; Sat: 9-12 Medicaid - yes; Tricare - yes  [facility] - Bobbye Morton, MD; Daphane Shepherd, MD; Chestine Spore, MD; Haskell Riling, MD; Kate Sable, MD [address] [place], [place] [address] [phone] Judie PetitMarland Kitchen Nicholas LoseFrancee Nodal: 8-5; Sat: 8:30-12:30 Medicaid - yes;  Tricare - yes  [facility] Heck[facility]s - Beckey Rutter, MD; [place], [place] [address], [place], [place] [address] [phone] Mon-Fri: 8-5 Medicaid - yes; Tricare - yes  [facility] Pediatrics - [place], CPNP; [place], [place]; Dimple Casey, MD; Alisa Graff, MD; Cephus Shelling, MD; [address], [place], [place] [address] [phone] M-F: 8-5, closed 1-2 for lunch Medicaid - yes; Tricare - yes  [facility] Pediatrics - [facility] [place], [place]; [place], [place]; Katrinka Blazing, MD; [place], CPNP; [place], [place]; [place], MD; Earlene Plater, MD [address], [place], [place] [address] [phone] M-Thurs: Nicholas Lose; Fri: 8-6; Sat: 9-12; Sun 2-4 Medicaid - yes; Tricare - yes  [facility] Georgeanna Lea, MD; Evette Cristal, MD; Shea Stakes, FNP; Earney Mallet, DO; [address] [place], [place] [address] [phone] M-F: 8-5 Medicaid - yes; Tricare - yes  [facility] Pediatric Providers  [facility] Family Medicine -Collene Mares, MD; [place], NP [address], [place], [place] [address] [phone] M - Fri: 8am - 5pm, closed for lunch 12-1 Medicaid - Yes; Tricare - yes  [facility] Elinor Parkinson, MD; Victory Dakin, MD; [place], DO; Vinocur, MD;Hall, PA; Clent [place], [place]; Orvan Falconer, NP [phone] [address] [place], [place] [address] [phone] M-F 8:00 - 5:00, Sat 8:00 - 11:30 Medicaid - yes; Tricare - yes  [facility] Welton Flakes, MD; [place], MD, [address], MD, [place], MD, [place], MD; [place], NP; [place], [place];  [address] [place], [place] [address] [phone] M-F 8:10am - 5:00pm Medicaid - yes; Tricare - yes  [facility] Eustaquio Boyden, MD; [place], NP [address] [place], [place] [address] [phone] M-F 8:00 - 5:00 Medicaid - [place] Medicaid only; Tricare - yes  [facility] - [facility] [address] [place], MD; [place], NP [address], [place], [place] [address] [phone] M-F 8:00 - 5:00; Closed for lunch 12 - 1:00 Medicaid - yes; Tricare - yes  [facility] Belva Crome, MD; Jonita Albee, FNP [address], [place], [place] [address] [phone] Mon 9-5; Tues/Wed 10-5; Thurs 8:30-5; Fri: 8-12:30 Medicaid - yes; Tricare - yes  [facility] Pediatric Providers  [facility]  [place], MD; [place], [place] [address] [place], [place] [address] [phone] phone [phone] fax M-F 7:15 - 4:30 Medicaid - yes; Tricare - yes  [place] -  Pediatrics Karilyn Cota, MD; [place], DO [address], [place], [place] [address] [phone] M-Fri: 8:30 - 5:00, closed for lunch everyday noon - 1pm Medicaid - Yes; Tricare - yes  [facility] Burd[place], MD; Reuel Boom, MD; Dimas Aguas, MD; Neita Carp, MD; [place], [place]; Bonnita Nasuti, [place]; [place], [place]; [place],  PA; [place], [place] [address] [place], [place] [address] [phone] M-Thurs: 7:30am - 7:00pm; Friday 7:30am - 4pm; Sat: 8:00 - 1:00 Medicaid - Yes; Tricare - yes  [place] [facility] Norval Morton, MD; Conni Elliot, MD; Carroll Kinds, MD; [place], DO [address], [place], [place] [address] [phone] M-Thur: 8:00 - 5:00, Fri: 8:00 - Noon Medicaid - yes; Tricare - yes No  [facility]  [place] [facility] [place], MD; Nadine Counts, DO; [place], NP; Daphine Deutscher, NP; Lequita Halt, NP; Ellamae Sia, NP; Reginia Forts, NP; Darlyn Read, MD; [place], [place] [address], [place], [place] [address] [phone] M-F 8:00 - 5:00 Medicaid - yes; Tricare - yes  [facility], FNP-C; [place], FNP-C [address] [place], [place] [address] [phone] M, W, R 8:00-5:00, Tues: 8:00am - 7:00pm; Fri 8:00 - noon Medicaid - Yes; Tricare - yes  [facility], MD [address] [place], [place] [address] [phone]  M-Thurs 8:30-5:30, Fri: 8:30-12:30pm Medicaid - Yes; Tricare - N    Considering Waterbirth? Guide for patients at [facility]) Why consider waterbirth? Gentle birth for babies  Less pain medicine used in labor  May allow for passive descent/less pushing  May reduce perineal tears  More mobility and instinctive maternal position changes  Increased maternal relaxation   Is waterbirth safe? What are the risks of infection, drowning or other complications? Infection:  Very low risk (3.7 % for tub vs 4.8% for bed)  7 in 8000 waterbirths with documented infection  Poorly cleaned equipment most common cause  Slightly lower group B strep transmission rate  Drowning  Maternal:  Very low risk  Related to seizures or fainting  Newborn:  Very low risk. No evidence of increased risk of respiratory problems in multiple large studies  Physiological protection from breathing under water  Avoid underwater birth if there are any fetal complications  Once baby's head is out of the water, keep it out.  Birth complication  Some reports of cord trauma, but risk decreased by bringing baby to surface gradually  No evidence of increased risk of shoulder dystocia. Mothers can usually change positions faster in water than in a bed, possibly aiding the maneuvers to free the shoulder.   There are 2 things you MUST do to have a waterbirth with [facility]: Attend a waterbirth class at [facility] at [facility]   3rd Wednesday of every month from 7-9 pm (virtual during COVID) [employer] at [email or web address] or [email or web address] or by calling [phone] Bring [place] the certificate from the class to your prenatal appointment or send via MyChart Meet with a midwife at 36 weeks* to see if you can still plan a waterbirth and to sign the consent.   *We also recommend that you schedule as many of your prenatal  visits with a midwife as possible.    Helpful information: You may want to bring a bathing suit top to the hospital to wear during labor but this is optional.  All other supplies are provided by the hospital. Please arrive at the hospital with signs of active labor, and do not wait at home until late in labor. It takes 45 min- 1 hour for fetal monitoring, and check in to your room to take place, plus transport and filling of the waterbirth tub.    Things that would prevent you from having a waterbirth: Premature, <37wks  Previous cesarean birth  Presence of thick meconium-stained fluid  Multiple gestation (Twins, triplets, etc.)  Uncontrolled diabetes or gestational diabetes requiring medication  Hypertension diagnosed in pregnancy or preexisting hypertension (gestational hypertension, preeclampsia, or chronic hypertension) Fetal growth restriction (your baby measures less than 10th percentile on ultrasound) Heavy vaginal bleeding  Non-reassuring fetal heart rate  Active infection (MRSA, etc.). Group B Strep is NOT a contraindication for waterbirth.  If your labor has to be induced and induction method requires continuous monitoring of the baby's heart rate  Other risks/issues identified by your obstetrical provider   Please remember that birth is unpredictable. Under certain unforeseeable circumstances your provider may advise against giving birth in the tub. These decisions will be made on a case-by-case basis and with the safety of you and your baby as our highest priority.

## 2023-07-09 NOTE — Progress Notes (Signed)
 New OB Intake  I connected with Stacy Mcgee  on 07/09/23 at 11:15 AM EDT by MyChart Video Visit and verified that I am speaking with the correct person using two identifiers. Nurse is located at [facility] and pt is located at home.  I discussed the limitations, risks, security and privacy concerns of performing an evaluation and management service by telephone and the availability of in person appointments. I also discussed with the patient that there may be a patient responsible charge related to this service. The patient expressed understanding and agreed to proceed.  I explained I am completing New OB Intake today. We discussed EDD of 01/02/2024, by Ultrasound. Pt is G1P0. I reviewed her allergies, medications and Medical/Surgical/OB history.    Patient Active Problem List   Diagnosis Date Noted   Eczema 10/07/2022   Screening examination for STI 10/07/2022   Migraine without aura and without status migrainosus, not intractable 03/04/2015   Tension headache 03/04/2015   Sleeping difficulty 03/04/2015    Concerns addressed today  Delivery Plans Plans to deliver at [facility]. Discussed the nature of our practice with multiple providers including residents and students. Due to the size of the practice, the delivering provider may not be the same as those providing prenatal care.   Patient is interested in water birth. Offered upcoming OB visit with CNM to discuss further.  MyChart/Babyscripts MyChart access verified. I explained pt will have some visits in office and some virtually. Babyscripts instructions given and order placed. Patient verifies receipt of registration text/e-mail. Account successfully created and app downloaded. If patient is a candidate for Optimized scheduling, add to sticky note.   Blood Pressure Cuff/Weight Scale Blood pressure cuff ordered for patient to pick-up from [employer]. Explained after first prenatal appt pt will check weekly and document in  Babyscripts.  Anatomy US  Explained first scheduled US  will be around 19 weeks. Anatomy US  scheduled for 08/08/2023 at 8:00am.  Is patient a CenteringPregnancy candidate?  Declined Declined due to Enrolled in [facility]    Is patient a Mom+Baby Combined Care candidate?  Accepted   If accepted, confirm patient does not intend to move from the area for at least 12 months, then notify Mom+Baby staff  Interested in [place]? If yes, send referral and doula dot phrase.   Is patient a candidate for Babyscripts Optimization? Yes, patient declined   First visit review I reviewed new OB appt with patient. Explained pt will be seen by Dr. Nolon Baxter, MD at first visit. Discussed Linard [place] genetic screening with patient. Panorama and Horizon.. Routine prenatal labs is needed at nob visit.  Last Pap No results found for: "DIAGPAP"  Allene Ivan, CMA 07/09/2023  11:23 AM

## 2023-07-16 ENCOUNTER — Other Ambulatory Visit: Payer: Self-pay

## 2023-07-16 ENCOUNTER — Encounter: Payer: Self-pay | Admitting: Obstetrics and Gynecology

## 2023-07-16 ENCOUNTER — Ambulatory Visit (INDEPENDENT_AMBULATORY_CARE_PROVIDER_SITE_OTHER): Payer: Self-pay | Admitting: Obstetrics and Gynecology

## 2023-07-16 ENCOUNTER — Other Ambulatory Visit (HOSPITAL_COMMUNITY)
Admission: RE | Admit: 2023-07-16 | Discharge: 2023-07-16 | Disposition: A | Discharge status: Home / Self Care | Source: Ambulatory Visit | Attending: Obstetrics and Gynecology | Admitting: Obstetrics and Gynecology

## 2023-07-16 VITALS — BP 120/66 | HR 72 | Wt 216.0 lb

## 2023-07-16 DIAGNOSIS — Z3A15 15 weeks gestation of pregnancy: Secondary | ICD-10-CM

## 2023-07-16 DIAGNOSIS — Z3402 Encounter for supervision of normal first pregnancy, second trimester: Secondary | ICD-10-CM

## 2023-07-16 DIAGNOSIS — Z3481 Encounter for supervision of other normal pregnancy, first trimester: Secondary | ICD-10-CM

## 2023-07-16 DIAGNOSIS — Z3492 Encounter for supervision of normal pregnancy, unspecified, second trimester: Secondary | ICD-10-CM | POA: Diagnosis present

## 2023-07-16 NOTE — Progress Notes (Signed)
 INITIAL PRENATAL VISIT NOTE  Subjective:  Stacy Mcgee is a 28 y.o. G1P0 at [redacted]w[redacted]d by 6 weeks US  being seen today for her initial prenatal visit. She has an obstetric history significant for n/a. She has a medical history significant for allergies, asthma, eczema.  Patient reports  significant fatigue .  Contractions: Not present. Vag. Bleeding: None.  Movement: Present. Denies leaking of fluid.    Past Medical History:  Diagnosis Date   Adult body mass index 36.0-36.9 10/17/2012   Allergic rhinitis 10/24/2012   STORY: hx of med in past.`E1o3L`IMPRESSION: rx to pharmacy, to call if worsening     Asthma    Asthma 10/24/2012   STORY: seen friday in ER. [place]. `E1o3L`steroid pack, given spacer for her ventolin . `E1o3L`feeling much better overall, has tight cough.`E1o3L`IMPRESSION: spirometry today shows mild obstruction. `E1o3L`cont qvar bid, use ventolin  prn qid. call prn`E1o3L`discussed avoiding allergens, no smoking in house. con't claritin. f/u for CPE     Atopic dermatitis and related condition 10/24/2012   IMPRESSION: Severe.  Has no dermatologist, and has been using just Eucerin cream which has proven ineffective.  Rx of Triamcinolone  0.5% to use with Eucerin BID for one month and follow up at that time.  Add Zyrtec daily for allergic component.     Eczema    Generalized headaches    Irregular menstrual cycle 10/24/2012   Migraine without aura and without status migrainosus, not intractable 03/04/2015   Obesity 10/24/2012   IMPRESSION: discussed portions, food choices, avoid sugar drinks, increase exercise     Problems influencing health status 10/24/2012   Screening examination for STI 10/07/2022   Seasonal allergies    Sleeping difficulty 03/04/2015   Tension headache 03/04/2015    Past Surgical History:  Procedure Laterality Date   HAND SURGERY  02/2012    OB History  Gravida Para Term Preterm AB Living  1       SAB IAB Ectopic Multiple Live Births           # Outcome Date GA Lbr Len/2nd Weight Sex Type Anes PTL Lv  1 Current             Social History   Socioeconomic History   Marital status: Significant Other    Spouse name: Not on file   Number of children: Not on file   Years of education: Not on file   Highest education level: Not on file  Occupational History   Not on file  Tobacco Use   Smoking status: Former    Current packs/day: 0.00    Types: Cigarettes    Quit date: 05/11/2023    Years since quitting: 0.1   Smokeless tobacco: Never   Tobacco comments:    4 cigarettes per day  Vaping Use   Vaping status: Never Used  Substance and Sexual Activity   Alcohol use: Not Currently    Comment: occ   Drug use: Not Currently    Types: Marijuana   Sexual activity: Yes    Birth control/protection: None  Other Topics Concern   Not on file  Social History Narrative   Chanese is in twelfth grade at [employer]. She is doing well.    Lives with her mother, younger brother, and younger sister. She has a 56 year old sister that does not live in the home.   Social Drivers of Corporate investment banker Strain: Not on file  Food Insecurity: Food Insecurity Present (07/16/2023)   Hunger Vital  Sign    Worried About Programme researcher, broadcasting/film/video in the Last Year: Sometimes true    Ran Out of Food in the Last Year: Sometimes true  Transportation Needs: No Transportation Needs (07/16/2023)   PRAPARE - Administrator, Civil Service (Medical): No    Lack of Transportation (Non-Medical): No  Physical Activity: Not on file  Stress: Not on file  Social Connections: Not on file    Family History  Problem Relation Age of Onset   Bipolar disorder Mother    Depression Mother    Migraines Father    ADD / ADHD Sister    Migraines Maternal Aunt    Cancer Maternal Grandfather    Heart Problems Paternal Grandfather      Current Outpatient Medications:    Blood Pressure Monitoring (BLOOD PRESSURE KIT) DEVI, 1 each by  Does not apply route as needed., Disp: 1 each, Rfl: 0   Prenatal 27-1 MG TABS, Take 1 tablet by mouth daily., Disp: 30 tablet, Rfl: 11   Doxylamine -Pyridoxine  10-10 MG TBEC, Take 2 tablets by mouth at bedtime. (Patient not taking: Reported on 07/16/2023), Disp: 60 tablet, Rfl: 2  Allergies  Allergen Reactions   Peanut-Containing Drug Products Anaphylaxis    Review of Systems: Negative except for what is mentioned in HPI.  Objective:   Vitals:   07/16/23 1052  BP: 120/66  Pulse: 72  Weight: 216 lb (98 kg)    Fetal Status: Fetal Heart Rate (bpm): 150   Movement: Present     Physical Exam: BP 120/66   Pulse 72   Wt 216 lb (98 kg)   LMP 03/10/2023   BMI 38.26 kg/m  CONSTITUTIONAL: Well-developed, well-nourished female in no acute distress.  NEUROLOGIC: Alert and oriented to person, place, and time. Normal reflexes, muscle tone coordination. No cranial nerve deficit noted. PSYCHIATRIC: Normal mood and affect. Normal behavior. Normal judgment and thought content. SKIN: Skin is warm and dry. No rash noted. Not diaphoretic. No erythema. No pallor. HENT:  Normocephalic, atraumatic, External right and left ear normal. Oropharynx is clear and moist EYES: Conjunctivae and EOM are normal. Pupils are equal, round, and reactive to light. No scleral icterus.  NECK: Normal range of motion, supple, no masses CARDIOVASCULAR: Normal heart rate noted, regular rhythm RESPIRATORY: Effort and breath sounds normal, no problems with respiration noted BREASTS: deferred ABDOMEN: Soft, nontender, nondistended, gravid. GU: normal appearing external female genitalia, nulliparous normal appearing cervix, scant white discharge in vagina, no lesions noted Bimanual: 15 weeks sized uterus, no adnexal tenderness or palpable lesions noted MUSCULOSKELETAL: Normal range of motion. EXT:  No edema and no tenderness. 2+ distal pulses.   Assessment and Plan:  Pregnancy: G1P0 at [redacted]w[redacted]d by 1st trim US   1. Encounter  for supervision of low-risk pregnancy in second trimester (Primary) Reviewed [facility] [employer] structure, multiple providers, fellows, medical students, virtual visits, MyChart.  - Culture, OB Urine - Hemoglobin A1c - CBC/D/Plt+RPR+Rh+ABO+RubIgG... - Cytology - PAP( [place]) - AFP, Serum, Open Spina Bifida  2. [redacted] weeks gestation of pregnancy - Culture, OB Urine - Hemoglobin A1c - CBC/D/Plt+RPR+Rh+ABO+RubIgG... - Cytology - PAP( [place]) - AFP, Serum, Open Spina Bifida  3. Encounter for supervision of normal first pregnancy in second trimester - PANORAMA PRENATAL TEST  4. Encounter for supervision of other normal pregnancy, first trimester - HORIZON Basic Panel   Preterm labor symptoms and general obstetric precautions including but not limited to vaginal bleeding, contractions, leaking of fluid and fetal  movement were reviewed in detail with the patient.  Please refer to After Visit Summary for other counseling recommendations.   Return in about 1 month (around 08/15/2023) for low OB.  Jan Mcgill 07/16/2023 1:00 PM

## 2023-07-17 LAB — CULTURE, OB URINE

## 2023-07-17 LAB — URINE CULTURE, OB REFLEX

## 2023-07-18 LAB — CBC/D/PLT+RPR+RH+ABO+RUBIGG...
Antibody Screen: NEGATIVE
Basophils Absolute: 0 10*3/uL (ref 0.0–0.2)
Basos: 0 %
EOS (ABSOLUTE): 0.3 10*3/uL (ref 0.0–0.4)
Eos: 3 %
HCV Ab: NONREACTIVE
HIV Screen 4th Generation wRfx: NONREACTIVE
Hematocrit: 38.8 % (ref 34.0–46.6)
Hemoglobin: 12.1 g/dL (ref 11.1–15.9)
Hepatitis B Surface Ag: NEGATIVE
Immature Grans (Abs): 0 10*3/uL (ref 0.0–0.1)
Immature Granulocytes: 0 %
Lymphocytes Absolute: 1.7 10*3/uL (ref 0.7–3.1)
Lymphs: 17 %
MCH: 28.3 pg (ref 26.6–33.0)
MCHC: 31.2 g/dL — ABNORMAL LOW (ref 31.5–35.7)
MCV: 91 fL (ref 79–97)
Monocytes Absolute: 0.7 10*3/uL (ref 0.1–0.9)
Monocytes: 7 %
Neutrophils Absolute: 7.3 10*3/uL — ABNORMAL HIGH (ref 1.4–7.0)
Neutrophils: 73 %
Platelets: 371 10*3/uL (ref 150–450)
RBC: 4.27 x10E6/uL (ref 3.77–5.28)
RDW: 13.6 % (ref 11.7–15.4)
RPR Ser Ql: NONREACTIVE
Rh Factor: POSITIVE
Rubella Antibodies, IGG: 1.6 {index} (ref >0.99)
WBC: 10 10*3/uL (ref 3.4–10.8)

## 2023-07-18 LAB — HEMOGLOBIN A1C
Est. average glucose Bld gHb Est-mCnc: 117 mg/dL
Hgb A1c MFr Bld: 5.7 % — ABNORMAL HIGH (ref 4.8–5.6)

## 2023-07-18 LAB — AFP, SERUM, OPEN SPINA BIFIDA
AFP MoM: 0.81
AFP Value: 22 ng/mL
Gest. Age on Collection Date: 15.5 wk
Maternal Age At EDD: 26.2 a
OSBR Risk 1 IN: 10000
Test Results:: NEGATIVE
Weight: 216 [lb_av]

## 2023-07-18 LAB — CYTOLOGY - PAP
Chlamydia: NEGATIVE
Comment: NEGATIVE
Comment: NEGATIVE
Comment: NORMAL
Diagnosis: NEGATIVE
High risk HPV: POSITIVE — AB
Neisseria Gonorrhea: NEGATIVE

## 2023-07-18 LAB — HCV INTERPRETATION

## 2023-07-21 LAB — PANORAMA PRENATAL TEST FULL PANEL:PANORAMA TEST PLUS 5 ADDITIONAL MICRODELETIONS: FETAL FRACTION: 8.2

## 2023-07-22 ENCOUNTER — Telehealth: Payer: Self-pay

## 2023-07-22 ENCOUNTER — Encounter: Payer: Self-pay | Admitting: Family Medicine

## 2023-07-22 ENCOUNTER — Encounter: Payer: Self-pay | Admitting: *Deleted

## 2023-07-22 DIAGNOSIS — R7303 Prediabetes: Secondary | ICD-10-CM | POA: Insufficient documentation

## 2023-07-22 NOTE — Telephone Encounter (Addendum)
-----   Message from Granville Layer sent at 07/22/2023  9:04 AM EDT ----- Elevated A1C needs early 2 hour  Left message that I am call with results and f/u if she could please give the office call back.   Saxon Crosby,RN  07/22/23

## 2023-07-24 NOTE — Telephone Encounter (Signed)
 Attempted to call patient regarding recent lab results. Left patient VM to call our office back. Will also send a message to patient via Mychart.   Moira Andrews, RN

## 2023-07-25 NOTE — Telephone Encounter (Signed)
 Called and spoke with patient. She is aware of her 2 hour gtt tomorrow. Reminded patient to be fasting after midnight. Reviewed testing will take 2 hours or more and she will be notified of results at a later time. Patient voiced understanding.

## 2023-07-26 ENCOUNTER — Other Ambulatory Visit

## 2023-07-26 ENCOUNTER — Other Ambulatory Visit: Payer: Self-pay

## 2023-07-26 DIAGNOSIS — R7303 Prediabetes: Secondary | ICD-10-CM

## 2023-07-26 LAB — HORIZON CUSTOM: REPORT SUMMARY: NEGATIVE

## 2023-07-29 ENCOUNTER — Other Ambulatory Visit: Payer: Self-pay

## 2023-07-29 DIAGNOSIS — R7303 Prediabetes: Secondary | ICD-10-CM

## 2023-07-29 DIAGNOSIS — Z3492 Encounter for supervision of normal pregnancy, unspecified, second trimester: Secondary | ICD-10-CM

## 2023-07-30 ENCOUNTER — Other Ambulatory Visit

## 2023-07-30 ENCOUNTER — Other Ambulatory Visit: Payer: Self-pay

## 2023-07-30 DIAGNOSIS — Z3492 Encounter for supervision of normal pregnancy, unspecified, second trimester: Secondary | ICD-10-CM

## 2023-07-30 DIAGNOSIS — R7303 Prediabetes: Secondary | ICD-10-CM

## 2023-07-31 LAB — GLUCOSE TOLERANCE, 2 HOURS W/ 1HR
Glucose, 1 hour: 97 mg/dL (ref 70–179)
Glucose, 2 hour: 54 mg/dL — ABNORMAL LOW (ref 70–152)
Glucose, Fasting: 80 mg/dL (ref 70–91)

## 2023-08-08 ENCOUNTER — Ambulatory Visit: Attending: Obstetrics and Gynecology

## 2023-08-08 ENCOUNTER — Ambulatory Visit (HOSPITAL_BASED_OUTPATIENT_CLINIC_OR_DEPARTMENT_OTHER): Admitting: Obstetrics

## 2023-08-08 ENCOUNTER — Other Ambulatory Visit: Payer: Self-pay | Admitting: *Deleted

## 2023-08-08 VITALS — BP 112/62 | HR 86

## 2023-08-08 DIAGNOSIS — E669 Obesity, unspecified: Secondary | ICD-10-CM | POA: Diagnosis not present

## 2023-08-08 DIAGNOSIS — O99212 Obesity complicating pregnancy, second trimester: Secondary | ICD-10-CM

## 2023-08-08 DIAGNOSIS — Z362 Encounter for other antenatal screening follow-up: Secondary | ICD-10-CM

## 2023-08-08 DIAGNOSIS — Z3A19 19 weeks gestation of pregnancy: Secondary | ICD-10-CM

## 2023-08-08 DIAGNOSIS — Z3492 Encounter for supervision of normal pregnancy, unspecified, second trimester: Secondary | ICD-10-CM

## 2023-08-08 DIAGNOSIS — R7303 Prediabetes: Secondary | ICD-10-CM

## 2023-08-08 DIAGNOSIS — Z3A14 14 weeks gestation of pregnancy: Secondary | ICD-10-CM

## 2023-08-08 NOTE — Progress Notes (Signed)
 MFM Consult Note  Ivon Wiginton is currently at 19 weeks and 0 days.  She was seen due to maternal obesity with a BMI of 38.  She denies any significant past medical history and denies any problems in her current pregnancy.    She had a cell free DNA test earlier in her pregnancy which indicated a low risk for trisomy 56, 29, and 13. A female fetus is predicted.   She was informed that the fetal growth and amniotic fluid level were appropriate for her gestational age.   The views of the fetal anatomy were unable to be fully visualized today due to the fetal position.  The patient was informed that anomalies may be missed due to technical limitations. If the fetus is in a suboptimal position or maternal habitus is increased, visualization of the fetus in the maternal uterus may be impaired.  Due to maternal obesity, we will continue to follow her with growth ultrasounds throughout her pregnancy.    A follow-up exam was scheduled in 5 weeks to assess the fetal growth and to complete the views of the fetal anatomy.    The patient stated that all of her questions were answered today.  A total of 30 minutes was spent counseling and coordinating the care for this patient.  Greater than 50% of the time was spent in direct face-to-face contact.

## 2023-08-13 ENCOUNTER — Encounter: Admitting: Certified Nurse Midwife

## 2023-09-10 ENCOUNTER — Encounter: Admitting: Family Medicine

## 2023-09-13 ENCOUNTER — Ambulatory Visit (HOSPITAL_BASED_OUTPATIENT_CLINIC_OR_DEPARTMENT_OTHER): Attending: Obstetrics | Admitting: Maternal & Fetal Medicine

## 2023-09-13 ENCOUNTER — Ambulatory Visit

## 2023-09-13 ENCOUNTER — Other Ambulatory Visit: Payer: Self-pay | Admitting: *Deleted

## 2023-09-13 VITALS — BP 137/67 | HR 73

## 2023-09-13 DIAGNOSIS — O99212 Obesity complicating pregnancy, second trimester: Secondary | ICD-10-CM

## 2023-09-13 DIAGNOSIS — Z363 Encounter for antenatal screening for malformations: Secondary | ICD-10-CM | POA: Insufficient documentation

## 2023-09-13 DIAGNOSIS — E669 Obesity, unspecified: Secondary | ICD-10-CM | POA: Diagnosis not present

## 2023-09-13 DIAGNOSIS — Z3A24 24 weeks gestation of pregnancy: Secondary | ICD-10-CM

## 2023-09-13 DIAGNOSIS — Z362 Encounter for other antenatal screening follow-up: Secondary | ICD-10-CM

## 2023-09-13 DIAGNOSIS — R7303 Prediabetes: Secondary | ICD-10-CM

## 2023-09-13 NOTE — Progress Notes (Signed)
 After review, MFM consult with provider is not indicated for today  Kristi Petties, MD 09/13/2023 1:12 PM  [facility]

## 2023-10-07 ENCOUNTER — Other Ambulatory Visit: Payer: Self-pay | Admitting: *Deleted

## 2023-10-07 DIAGNOSIS — Z3490 Encounter for supervision of normal pregnancy, unspecified, unspecified trimester: Secondary | ICD-10-CM

## 2023-10-08 ENCOUNTER — Other Ambulatory Visit

## 2023-10-08 ENCOUNTER — Other Ambulatory Visit (HOSPITAL_COMMUNITY)
Admission: RE | Admit: 2023-10-08 | Discharge: 2023-10-08 | Disposition: A | Discharge status: Home / Self Care | Source: Ambulatory Visit | Attending: Family Medicine | Admitting: Family Medicine

## 2023-10-08 ENCOUNTER — Ambulatory Visit (INDEPENDENT_AMBULATORY_CARE_PROVIDER_SITE_OTHER): Admitting: Family Medicine

## 2023-10-08 ENCOUNTER — Other Ambulatory Visit: Payer: Self-pay

## 2023-10-08 VITALS — BP 111/72 | HR 83 | Wt 224.9 lb

## 2023-10-08 DIAGNOSIS — N898 Other specified noninflammatory disorders of vagina: Secondary | ICD-10-CM | POA: Insufficient documentation

## 2023-10-08 DIAGNOSIS — Z3493 Encounter for supervision of normal pregnancy, unspecified, third trimester: Secondary | ICD-10-CM

## 2023-10-08 DIAGNOSIS — R7303 Prediabetes: Secondary | ICD-10-CM | POA: Diagnosis not present

## 2023-10-08 DIAGNOSIS — Z3A27 27 weeks gestation of pregnancy: Secondary | ICD-10-CM | POA: Diagnosis not present

## 2023-10-08 DIAGNOSIS — Z3490 Encounter for supervision of normal pregnancy, unspecified, unspecified trimester: Secondary | ICD-10-CM

## 2023-10-08 NOTE — Progress Notes (Signed)
   Subjective:  Stacy Mcgee is a 28 y.o. G1P0 at [redacted]w[redacted]d being seen today for ongoing prenatal care.  She is currently monitored for the following issues for this low-risk pregnancy and has Supervision of low-risk pregnancy and Prediabetes on their problem list.  Patient reports no complaints.  Contractions: Not present.  .  Movement: Present. Denies leaking of fluid.   The following portions of the patient's history were reviewed and updated as appropriate: allergies, current medications, past family history, past medical history, past social history, past surgical history and problem list. Problem list updated.  Objective:   Vitals:   10/08/23 0855  BP: 111/72  Pulse: 83  Weight: 224 lb 14.4 oz (102 kg)    Fetal Status: Fetal Heart Rate (bpm): 150   Movement: Present     General:  Alert, oriented and cooperative. Patient is in no acute distress.  Skin: Skin is warm and dry. No rash noted.   Cardiovascular: Normal heart rate noted  Respiratory: Normal respiratory effort, no problems with respiration noted  Abdomen: Soft, gravid, appropriate for gestational age. Pain/Pressure: Absent     Pelvic:       Cervical exam deferred        Extremities: Normal range of motion.  Edema: None  Mental Status: Normal mood and affect. Normal behavior. Normal judgment and thought content.   Urinalysis:      Assessment and Plan:  Pregnancy: G1P0 at [redacted]w[redacted]d  1. Encounter for supervision of low-risk pregnancy in third trimester (Primary) BP and FHR normal Third trimester labs today, doing 1 hr GTT as she has to be at work soon [employer], declines Works at [employer] on [address] but wants to reduce hours and have some work restrictions, asked her to send us  a detailed message and we can provide her with a work letter no problem  2. Prediabetes Mildly elevated A1c at new OB, 2 hr GTT was normal Repeat GTT today  Preterm labor symptoms and general obstetric precautions including but not limited  to vaginal bleeding, contractions, leaking of fluid and fetal movement were reviewed in detail with the patient. Please refer to After Visit Summary for other counseling recommendations.  Return in 2 weeks (on 10/22/2023) for Dyad patient, ob visit.   Lola Donnice HERO, MD

## 2023-10-08 NOTE — Patient Instructions (Signed)

## 2023-10-09 ENCOUNTER — Ambulatory Visit: Payer: Self-pay | Admitting: Family Medicine

## 2023-10-09 DIAGNOSIS — Z3493 Encounter for supervision of normal pregnancy, unspecified, third trimester: Secondary | ICD-10-CM

## 2023-10-09 LAB — CERVICOVAGINAL ANCILLARY ONLY
Bacterial Vaginitis (gardnerella): POSITIVE — AB
Candida Glabrata: NEGATIVE
Candida Vaginitis: POSITIVE — AB
Chlamydia: NEGATIVE
Comment: NEGATIVE
Comment: NEGATIVE
Comment: NEGATIVE
Comment: NEGATIVE
Comment: NEGATIVE
Comment: NORMAL
Neisseria Gonorrhea: NEGATIVE
Trichomonas: NEGATIVE

## 2023-10-09 LAB — GLUCOSE, 1 HOUR GESTATIONAL: Gestational Diabetes Screen: 74 mg/dL (ref 70–139)

## 2023-10-09 MED ORDER — METRONIDAZOLE 0.75 % VA GEL: 1.0000 | Freq: Every day | VAGINAL | 0 refills | Status: AC

## 2023-10-09 MED ORDER — FLUCONAZOLE 150 MG PO TABS: 150.0000 mg | ORAL_TABLET | Freq: Once | ORAL | 0 refills | Status: AC

## 2023-10-11 ENCOUNTER — Ambulatory Visit

## 2023-10-15 ENCOUNTER — Ambulatory Visit (HOSPITAL_BASED_OUTPATIENT_CLINIC_OR_DEPARTMENT_OTHER)

## 2023-10-15 ENCOUNTER — Ambulatory Visit: Attending: Obstetrics | Admitting: Obstetrics

## 2023-10-15 VITALS — BP 127/68

## 2023-10-15 DIAGNOSIS — O99213 Obesity complicating pregnancy, third trimester: Secondary | ICD-10-CM | POA: Diagnosis not present

## 2023-10-15 DIAGNOSIS — Z3A28 28 weeks gestation of pregnancy: Secondary | ICD-10-CM

## 2023-10-15 DIAGNOSIS — R7303 Prediabetes: Secondary | ICD-10-CM

## 2023-10-15 DIAGNOSIS — Z362 Encounter for other antenatal screening follow-up: Secondary | ICD-10-CM | POA: Diagnosis not present

## 2023-10-15 DIAGNOSIS — E669 Obesity, unspecified: Secondary | ICD-10-CM

## 2023-10-15 DIAGNOSIS — Z3493 Encounter for supervision of normal pregnancy, unspecified, third trimester: Secondary | ICD-10-CM

## 2023-10-15 NOTE — Progress Notes (Signed)
 MFM Consult Note  Stacy Mcgee is currently at 28 weeks and 5 days.  She has been followed due to maternal obesity with a BMI of 38.    She denies any problems since her last exam and has screened negative for gestational diabetes in her current pregnancy.  On today's exam, the overall EFW of 2 pounds 13 ounces measures at the 37th percentile for her gestational age.    There was normal amniotic fluid with a total AFI of 23.66 cm.    Due to maternal obesity, a follow-up growth scan was scheduled in 6 weeks.    The patient stated that all of her questions were answered today.  A total of 10 minutes was spent counseling and coordinating the care for this patient.  Greater than 50% of the time was spent in direct face-to-face contact.

## 2023-10-16 ENCOUNTER — Other Ambulatory Visit: Payer: Self-pay | Admitting: *Deleted

## 2023-10-16 DIAGNOSIS — O99213 Obesity complicating pregnancy, third trimester: Secondary | ICD-10-CM

## 2023-10-21 ENCOUNTER — Encounter: Admitting: Family Medicine

## 2023-10-30 ENCOUNTER — Other Ambulatory Visit: Payer: Self-pay

## 2023-10-30 ENCOUNTER — Inpatient Hospital Stay (HOSPITAL_COMMUNITY)
Admission: AD | Admit: 2023-10-30 | Discharge: 2023-10-30 | Disposition: A | Discharge status: Home / Self Care | Attending: Obstetrics and Gynecology | Admitting: Obstetrics and Gynecology

## 2023-10-30 ENCOUNTER — Encounter (HOSPITAL_COMMUNITY): Payer: Self-pay | Admitting: Obstetrics and Gynecology

## 2023-10-30 DIAGNOSIS — G8929 Other chronic pain: Secondary | ICD-10-CM

## 2023-10-30 DIAGNOSIS — O26893 Other specified pregnancy related conditions, third trimester: Secondary | ICD-10-CM | POA: Diagnosis not present

## 2023-10-30 DIAGNOSIS — Z3493 Encounter for supervision of normal pregnancy, unspecified, third trimester: Secondary | ICD-10-CM

## 2023-10-30 DIAGNOSIS — M5442 Lumbago with sciatica, left side: Secondary | ICD-10-CM

## 2023-10-30 DIAGNOSIS — Z3A30 30 weeks gestation of pregnancy: Secondary | ICD-10-CM

## 2023-10-30 DIAGNOSIS — O2313 Infections of bladder in pregnancy, third trimester: Secondary | ICD-10-CM | POA: Diagnosis not present

## 2023-10-30 DIAGNOSIS — N3000 Acute cystitis without hematuria: Secondary | ICD-10-CM | POA: Diagnosis not present

## 2023-10-30 LAB — URINALYSIS, ROUTINE W REFLEX MICROSCOPIC
Bacteria, UA: NONE SEEN
Bilirubin Urine: NEGATIVE
Glucose, UA: NEGATIVE mg/dL
Hgb urine dipstick: NEGATIVE
Ketones, ur: 20 mg/dL — AB
Nitrite: NEGATIVE
Protein, ur: NEGATIVE mg/dL
Specific Gravity, Urine: 1.014 (ref 1.005–1.030)
pH: 6 (ref 5.0–8.0)

## 2023-10-30 MED ORDER — CEPHALEXIN 500 MG PO CAPS: 500.0000 mg | ORAL_CAPSULE | Freq: Four times a day (QID) | ORAL | 0 refills | Status: DC

## 2023-10-30 MED ORDER — ACETAMINOPHEN 500 MG PO TABS
1000.0000 mg | ORAL_TABLET | Freq: Once | ORAL | Status: AC
  Administered 2023-10-30: 1000 mg via ORAL
  Filled 2023-10-30: qty 2

## 2023-10-30 NOTE — Discharge Instructions (Signed)

## 2023-10-30 NOTE — MAU Provider Note (Addendum)
 Maternal Assessment Unit Provider Note  S [name] is a 28 y.o. G1P0 pregnant female at [redacted]w[redacted]d who presents to MAU today with complaint of lower abdo/pelvic pressure and back pain.   Today while at work she was on her feet all day and started noticing intermittent lower abdominal/suprapubic pressure lasting about 5 to 7 minutes and then stopping for about 10 minutes.  She also has had increased frequency of urination and urgency.  At the same time she noticed some sharp pain in her low back with an episode of left thigh numbness which lasted about 10 minutes.  She has had 1 prior episode of left thigh numbness and pregnancy.  She does have chronic low back pain but it was more intense today.  No improvement of her back pain even with sitting after getting off work.   No leaking fluid, no vaginal bleeding or discharge.  Normal fetal movement.  No headache, no vision changes, no chest pain, no shortness of breath.  She does endorse some foot swelling at the end of the day after being on her feet all day, but no change from baseline.  She reports poor p.o. intake of fluids today.  No saddle area numbness.  No urine retention or bowel incontinence.  No history of back trauma   [facility]. Prenatal records reviewed.  Pertinent items noted in HPI and remainder of comprehensive ROS otherwise negative.   O BP 130/69 (BP Location: Right Arm)   Pulse 82   Temp 98.4 F (36.9 C) (Oral)   Resp 16   Ht 5' 3 (1.6 m)   Wt 100.7 kg   LMP 03/10/2023   SpO2 100%   BMI 39.33 kg/m  Physical Exam Constitutional:      General: She is not in acute distress.    Appearance: She is well-developed. She is obese. She is not toxic-appearing.  Cardiovascular:     Rate and Rhythm: Normal rate and regular rhythm.  Pulmonary:     Effort: Pulmonary effort is normal.     Breath sounds: Normal breath sounds. No wheezing, rhonchi or rales.  Abdominal:     General: Bowel sounds are normal.      Palpations: Abdomen is soft.     Tenderness: There is no abdominal tenderness.     Comments: Gravid uterus  Musculoskeletal:     Comments: Midline low back tenderness to palpation  Skin:    General: Skin is warm and dry.  Neurological:     Mental Status: She is alert.     Deep Tendon Reflexes:     Reflex Scores:      Patellar reflexes are 2+ on the right side.      Achilles reflexes are 2+ on the right side.    Comments: Hip flexion, knee extension, knee flexion, dorsiflexion, plantarflexion are all 5/5 strength bilaterally.  Negative straight leg test.  Normal bilateral lower extremity sensation    1940 fetal heart tones: Baseline 160 bpm, moderate variability, no accelerations, no decelerations.  No contractions.  MDM: Moderate risk   MAU Course:  1940 fetal heart tones noted to be borderline tachycardic.  Patient assessment done by provider.  No back red flag symptoms. Lower extremity neurologic exam intact.  Ordered Tylenol  for pain.  Offered patient to drink 1 L of fluid versus IV fluids and she preferred to take fluid p.o.   Counseled regarding diagnosis of low back pain and pregnancy.  Will give informational handout and refer  to outpatient physical therapy.  History consistent with symptoms of UTI.  Urinalysis pending. Culture ordered.  2050 Urinalysis, demonstrates leukocytes. Will treat based on history  2100 Signed patient out to Stacy Mcgee, CNM   A   ICD-10-CM   1. Chronic midline low back pain with left-sided sciatica  M54.42 Ambulatory referral to Physical Therapy   G89.29     2. Encounter for supervision of low-risk pregnancy in third trimester  Z34.93     3. Acute cystitis without hematuria  N30.00      Medical screening exam complete  P -PRN tylenol  for pain -encouraged adequate hydration -outpatient PT referral -low back pain education handout given -cephalexin  500mg  QID for 5 days to treat UTI base on hx, cx pending.   Discharge from MAU  in stable condition with strict return/labor precautions Follow up at [facility] as scheduled for ongoing prenatal care  Allergies as of 10/30/2023       Reactions   Peanut-containing Drug Products Anaphylaxis        Medication List     STOP taking these medications    Doxylamine -Pyridoxine  10-10 MG Tbec       TAKE these medications    Blood Pressure Kit Devi 1 each by Does not apply route as needed.   cephALEXin  500 MG capsule Commonly known as: KEFLEX  Take 1 capsule (500 mg total) by mouth 4 (four) times daily.   Prenatal 27-1 MG Tabs Take 1 tablet by mouth daily.        Trudy Leeroy NOVAK, MD 10/30/2023 7:34 PM    - Assumed care from Dr. Trudy at 2000.  - Reassessment and patient reports feeling better.  -plan for discharge   1. Chronic midline low back pain with left-sided sciatica   2. Encounter for supervision of low-risk pregnancy in third trimester   3. Acute cystitis without hematuria   4. [redacted] weeks gestation of pregnancy    - Reviewed that sciatica  pain can be a normal discomfort of pregnancy.  - Also discussed that pain could be related to UTI in pregnancy,  - Keflex  sent to outpatient pharmacy.  - Reviewed worsening signs and return precautions  - FHT appropriate for gestational age at time of discharge.  - Patient discharged home in stable condition and may return to MAU as needed.   Stacy Mcgee) [place], MSN, CNM  Center for [facility]  10/30/2023 11:52 PM

## 2023-10-30 NOTE — MAU Note (Signed)
 Fluids, juice and crackers given per provider order.

## 2023-10-30 NOTE — MAU Note (Signed)
 Stacy Mcgee is a 28 y.o. at [redacted]w[redacted]d here in MAU reporting: abd and back pain started 1430 and left thigh been going numb. NO c/o SROM, vaginal bleeding, HA, chest pain, or visual disturbances.  +FM   LMP:  Onset of complaint: 1430 Pain score: 9/10 Vitals:   10/30/23 1850  BP: 130/69  Pulse: 82  Resp: 16  Temp: 98.4 F (36.9 C)  SpO2: 100%     FHT: 166  Lab orders placed from triage: ua

## 2023-10-31 LAB — CULTURE, OB URINE: Culture: <10000 — AB

## 2023-11-04 ENCOUNTER — Encounter: Admitting: Family Medicine

## 2023-11-13 DIAGNOSIS — O99210 Obesity complicating pregnancy, unspecified trimester: Secondary | ICD-10-CM | POA: Insufficient documentation

## 2023-11-18 ENCOUNTER — Encounter: Admitting: Family Medicine

## 2023-11-25 ENCOUNTER — Encounter (HOSPITAL_COMMUNITY): Payer: Self-pay | Admitting: Obstetrics & Gynecology

## 2023-11-25 ENCOUNTER — Inpatient Hospital Stay (HOSPITAL_COMMUNITY)
Admission: AD | Admit: 2023-11-25 | Discharge: 2023-11-25 | Disposition: A | Discharge status: Home / Self Care | Attending: Obstetrics & Gynecology | Admitting: Obstetrics & Gynecology

## 2023-11-25 DIAGNOSIS — O26893 Other specified pregnancy related conditions, third trimester: Secondary | ICD-10-CM

## 2023-11-25 DIAGNOSIS — Z3A34 34 weeks gestation of pregnancy: Secondary | ICD-10-CM | POA: Diagnosis not present

## 2023-11-25 DIAGNOSIS — M549 Dorsalgia, unspecified: Secondary | ICD-10-CM | POA: Diagnosis present

## 2023-11-25 DIAGNOSIS — R1030 Lower abdominal pain, unspecified: Secondary | ICD-10-CM | POA: Insufficient documentation

## 2023-11-25 DIAGNOSIS — M5431 Sciatica, right side: Secondary | ICD-10-CM | POA: Diagnosis not present

## 2023-11-25 LAB — URINALYSIS, ROUTINE W REFLEX MICROSCOPIC
Bacteria, UA: NONE SEEN
Bilirubin Urine: NEGATIVE
Glucose, UA: NEGATIVE mg/dL
Hgb urine dipstick: NEGATIVE
Ketones, ur: NEGATIVE mg/dL
Nitrite: NEGATIVE
Protein, ur: NEGATIVE mg/dL
Specific Gravity, Urine: 1.015 (ref 1.005–1.030)
pH: 7 (ref 5.0–8.0)

## 2023-11-25 MED ORDER — ACETAMINOPHEN 500 MG PO TABS
1000.0000 mg | ORAL_TABLET | Freq: Once | ORAL | Status: AC
  Administered 2023-11-25: 1000 mg via ORAL
  Filled 2023-11-25: qty 2

## 2023-11-25 NOTE — MAU Provider Note (Signed)
 History     CSN: [id number]  Arrival date and time: 11/25/23 0849   Event Date/Time   First Provider Initiated Contact with Patient 11/25/23 [phone]      Chief Complaint  Patient presents with   Abdominal Pain   Back Pain   HPI  Ms.Stacy Mcgee is a 28 y.o. female [redacted]w[redacted]d here with right sciatic pain. The pain started last night.The pain starts in her right hip and travels down to her right knee. The pain is constant. The pain worsens when she puts pressure on her leg. She has no redness or calf pain. She reports lower abdominal pain that comes and goes. No vaginal bleeding.  Recently had sex.   OB History     Gravida  1   Para      Term      Preterm      AB      Living         SAB      IAB      Ectopic      Multiple      Live Births              Past Medical History:  Diagnosis Date   Adult body mass index 36.0-36.9 10/17/2012   Allergic rhinitis 10/24/2012   STORY: hx of med in past.`E1o3L`IMPRESSION: rx to pharmacy, to call if worsening     Asthma    Asthma 10/24/2012   STORY: seen friday in ER. [place]. `E1o3L`steroid pack, given spacer for her ventolin . `E1o3L`feeling much better overall, has tight cough.`E1o3L`IMPRESSION: spirometry today shows mild obstruction. `E1o3L`cont qvar bid, use ventolin  prn qid. call prn`E1o3L`discussed avoiding allergens, no smoking in house. con't claritin. f/u for CPE     Atopic dermatitis and related condition 10/24/2012   IMPRESSION: Severe.  Has no dermatologist, and has been using just Eucerin cream which has proven ineffective.  Rx of Triamcinolone  0.5% to use with Eucerin BID for one month and follow up at that time.  Add Zyrtec daily for allergic component.     Eczema    Generalized headaches    Irregular menstrual cycle 10/24/2012   Migraine without aura and without status migrainosus, not intractable 03/04/2015   Obesity 10/24/2012   IMPRESSION: discussed portions, food choices, avoid sugar drinks,  increase exercise     Problems influencing health status 10/24/2012   Screening examination for STI 10/07/2022   Seasonal allergies    Sleeping difficulty 03/04/2015   Tension headache 03/04/2015    Past Surgical History:  Procedure Laterality Date   HAND SURGERY  02/2012    Family History  Problem Relation Age of Onset   Bipolar disorder Mother    Depression Mother    Migraines Father    ADD / ADHD Sister    Migraines Maternal Aunt    Cancer Maternal Grandfather    Heart Problems Paternal Grandfather     Social History   Tobacco Use   Smoking status: Former    Current packs/day: 0.00    Types: Cigarettes    Quit date: 05/11/2023    Years since quitting: 0.5   Smokeless tobacco: Never   Tobacco comments:    4 cigarettes per day  Vaping Use   Vaping status: Never Used  Substance Use Topics   Alcohol use: Not Currently    Comment: occ   Drug use: Not Currently    Types: Marijuana    Comment: last use may 2025    Allergies:  Allergies  Allergen Reactions   Peanut-Containing Drug Products Anaphylaxis    Medications Prior to Admission  Medication Sig Dispense Refill Last Dose/Taking   Prenatal 27-1 MG TABS Take 1 tablet by mouth daily. 30 tablet 11 11/24/2023   Blood Pressure Monitoring (BLOOD PRESSURE KIT) DEVI 1 each by Does not apply route as needed. (Patient not taking: Reported on 10/15/2023) 1 each 0    cephALEXin  (KEFLEX ) 500 MG capsule Take 1 capsule (500 mg total) by mouth 4 (four) times daily. 20 capsule 0    Results for orders placed or performed during the hospital encounter of 11/25/23 (from the past 48 hours)  Urinalysis, Routine w reflex microscopic -Urine, Clean Catch     Status: Abnormal   Collection Time: 11/25/23  9:07 AM  Result Value Ref Range   Color, Urine YELLOW YELLOW   APPearance CLEAR CLEAR   Specific Gravity, Urine 1.015 1.005 - 1.030   pH 7.0 5.0 - 8.0   Glucose, UA NEGATIVE NEGATIVE mg/dL   Hgb urine dipstick NEGATIVE NEGATIVE    Bilirubin Urine NEGATIVE NEGATIVE   Ketones, ur NEGATIVE NEGATIVE mg/dL   Protein, ur NEGATIVE NEGATIVE mg/dL   Nitrite NEGATIVE NEGATIVE   Leukocytes,Ua TRACE (A) NEGATIVE   RBC / HPF 0-5 0 - 5 RBC/hpf   WBC, UA 0-5 0 - 5 WBC/hpf   Bacteria, UA NONE SEEN NONE SEEN   Squamous Epithelial / HPF 0-5 0 - 5 /HPF   Mucus PRESENT    Sperm, UA PRESENT     Comment: Performed at [facility], [address], [place], [place] [address]     Review of Systems  Constitutional:  Negative for fever.  Gastrointestinal:  Positive for abdominal pain.  Genitourinary:  Negative for vaginal bleeding and vaginal discharge.   Physical Exam   Blood pressure 128/73, pulse 83, temperature 98.1 F (36.7 C), temperature source Oral, resp. rate 18, last menstrual period 03/10/2023, SpO2 100%.  Physical Exam Constitutional:      General: She is not in acute distress.    Appearance: She is well-developed. She is not ill-appearing, toxic-appearing or diaphoretic.  Abdominal:     Palpations: Abdomen is soft.     Tenderness: There is no abdominal tenderness.  Genitourinary:    Comments: Dilation: Closed Station: -3 Exam by:: DOROTHA Emms, NP  Neurological:     Mental Status: She is alert.    Fetal Tracing: Baseline:  145 bpm Variability: Moderate  Accelerations: 15x15 Decelerations: None Toco: Occasional   MAU Course  Procedures  MDM  UA and urine culture (pending) Tylenol  given. Pain much improved  Assessment and Plan   A:  1. Sciatic pain, right   2. [redacted] weeks gestation of pregnancy   3. Lower abdominal pain      P:  Dc home  Return to MAU if symptoms worsen Ok to use tylenol  as needed for pain. Pregnancy support belt may be of help  Emms Nest I, NP 11/25/2023 12:20 PM

## 2023-11-25 NOTE — MAU Note (Signed)
 Stacy Mcgee is a 28 y.o. at [redacted]w[redacted]d here in MAU reporting: lower abd /pelvic  pressure and pain  and pain in her right hip that radiates down her leg  almost feels numb. Good fetal movement. Denies any vag discharge or bleeding  LMP:  Onset of complaint: last night Pain score: 8-10 There were no vitals filed for this visit.   FHT: 151  Lab orders placed from triage: u./a

## 2023-11-26 ENCOUNTER — Encounter: Payer: Self-pay | Admitting: Family Medicine

## 2023-11-26 ENCOUNTER — Ambulatory Visit: Attending: Obstetrics and Gynecology

## 2023-11-26 ENCOUNTER — Ambulatory Visit: Admitting: Advanced Practice Midwife

## 2023-11-26 ENCOUNTER — Ambulatory Visit (HOSPITAL_BASED_OUTPATIENT_CLINIC_OR_DEPARTMENT_OTHER): Admitting: Obstetrics

## 2023-11-26 VITALS — BP 125/73

## 2023-11-26 DIAGNOSIS — Z3493 Encounter for supervision of normal pregnancy, unspecified, third trimester: Secondary | ICD-10-CM | POA: Diagnosis not present

## 2023-11-26 DIAGNOSIS — Z3A34 34 weeks gestation of pregnancy: Secondary | ICD-10-CM | POA: Insufficient documentation

## 2023-11-26 DIAGNOSIS — O99213 Obesity complicating pregnancy, third trimester: Secondary | ICD-10-CM | POA: Diagnosis present

## 2023-11-26 DIAGNOSIS — E669 Obesity, unspecified: Secondary | ICD-10-CM

## 2023-11-26 DIAGNOSIS — O99210 Obesity complicating pregnancy, unspecified trimester: Secondary | ICD-10-CM

## 2023-11-26 DIAGNOSIS — Z3689 Encounter for other specified antenatal screening: Secondary | ICD-10-CM | POA: Diagnosis not present

## 2023-11-26 LAB — CULTURE, OB URINE: Culture: NO GROWTH

## 2023-11-26 NOTE — Progress Notes (Signed)
 MFM Consult Note  Stacy Mcgee is currently at 34 weeks and 5 days.  She has been followed due to maternal obesity with a BMI of 38.    She denies any problems since her last exam.    The patient was evaluated in the MAU yesterday due to lower pelvic pressure and contractions.  Her cervix was found to be closed.  Sonographic findings Single intrauterine pregnancy at 34w 5d.  Fetal cardiac activity:  Observed and appears normal. Presentation: Cephalic. Interval fetal anatomy appears normal. Fetal biometry shows the estimated fetal weight of 4 pounds 12 ounces at the 12 percentile. Amniotic fluid volume: Within normal limits. MVP: 6.5 cm. Placenta: Anterior.  There are limitations of prenatal ultrasound such as the inability to detect certain abnormalities due to poor visualization. Various factors such as fetal position, gestational age and maternal body habitus may increase the difficulty in visualizing the fetal anatomy.    Labor precautions were reviewed today.    She was advised to go to the hospital should she feel frequent contractions.    As the fetal growth is within normal limits, no further exams were scheduled in our office.    The patient stated that all of her questions were answered today.  A total of 10 minutes was spent counseling and coordinating the care for this patient.  Greater than 50% of the time was spent in direct face-to-face contact.

## 2023-11-26 NOTE — Progress Notes (Signed)
   PRENATAL VISIT NOTE  Subjective:  Stacy Mcgee is a 28 y.o. G1P0 at [redacted]w[redacted]d being seen today for ongoing prenatal care.  She is currently monitored for the following issues for this low-risk pregnancy and has Supervision of low-risk pregnancy; Prediabetes; and Obesity affecting pregnancy, antepartum on their problem list.   Patient reports increased contractions and pelvic pressure. Was seen in MAU yesterday for same Sx and cervix was closed. Feels like pressure is worse. Requests to start leave from work because of pelvic pain and swelling.   Contractions: Irregular. Vag. Bleeding: None.  Movement: Present. Denies leaking of fluid.   The following portions of the patient's history were reviewed and updated as appropriate: allergies, current medications, past family history, past medical history, past social history, past surgical history and problem list.   Objective:   Vitals:   11/26/23 1809  BP: 125/73    Fetal Status: Fetal Heart Rate (bpm): 152   Movement: Present     General:  Alert, oriented and cooperative. Patient is in no acute distress.  Skin: Skin is warm and dry. No rash noted.   Cardiovascular: Normal heart rate noted  Respiratory: Normal respiratory effort, no problems with respiration noted  Abdomen: Soft, gravid, appropriate for gestational age.  Pain/Pressure: Present     Pelvic: Cervical exam deferred Dilation: Closed Effacement (%): Thick    Extremities: Normal range of motion.  Edema: Moderate pitting, indentation subsides rapidly  Mental Status: Normal mood and affect. Normal behavior. Normal judgment and thought content.   Assessment and Plan:  Pregnancy: G1P0 at [redacted]w[redacted]d 1. Encounter for supervision of low-risk pregnancy in third trimester (Primary) - Note given to start leave from work. Discussed that is it not medically necessary but reasonable since there is no way for her to avoid standing for entire shift. Discussed maternity support belt and  compression stockings for comfort.   Preterm labor symptoms and general obstetric precautions including but not limited to vaginal bleeding, contractions, leaking of fluid and fetal movement were reviewed in detail with the patient. Please refer to After Visit Summary for other counseling recommendations.   Return in 6 days (on 12/02/2023) for as scheduled.  Future Appointments  Date Time Provider Department Center  12/02/2023 10:35 AM Ilean Norleen GAILS, MD [facility]  12/09/2023  1:35 PM Eldonna Suzen Octave, MD [facility]  12/16/2023  3:15 PM Eldonna Suzen Octave, MD [facility]  12/23/2023 10:35 AM Ilean, Norleen GAILS, MD [facility]    Kamryn Messineo  Claudene, CNM [facility] for [facility]

## 2023-12-02 ENCOUNTER — Encounter: Admitting: Family Medicine

## 2023-12-09 ENCOUNTER — Encounter: Admitting: Family Medicine

## 2023-12-10 ENCOUNTER — Inpatient Hospital Stay (HOSPITAL_COMMUNITY)
Admission: AD | Admit: 2023-12-10 | Discharge: 2023-12-13 | DRG: 805 | Disposition: A | Discharge status: Home / Self Care | Attending: Family Medicine | Admitting: Family Medicine

## 2023-12-10 ENCOUNTER — Encounter (HOSPITAL_COMMUNITY): Payer: Self-pay | Admitting: Obstetrics & Gynecology

## 2023-12-10 ENCOUNTER — Inpatient Hospital Stay (HOSPITAL_COMMUNITY): Admitting: Anesthesiology

## 2023-12-10 ENCOUNTER — Other Ambulatory Visit: Payer: Self-pay

## 2023-12-10 DIAGNOSIS — O26893 Other specified pregnancy related conditions, third trimester: Secondary | ICD-10-CM | POA: Diagnosis present

## 2023-12-10 DIAGNOSIS — Z3A36 36 weeks gestation of pregnancy: Secondary | ICD-10-CM

## 2023-12-10 DIAGNOSIS — O99824 Streptococcus B carrier state complicating childbirth: Secondary | ICD-10-CM | POA: Diagnosis present

## 2023-12-10 DIAGNOSIS — E66813 Obesity, class 3: Secondary | ICD-10-CM | POA: Diagnosis present

## 2023-12-10 DIAGNOSIS — O99214 Obesity complicating childbirth: Secondary | ICD-10-CM | POA: Diagnosis present

## 2023-12-10 DIAGNOSIS — O4290 Premature rupture of membranes, unspecified as to length of time between rupture and onset of labor, unspecified weeks of gestation: Principal | ICD-10-CM | POA: Diagnosis present

## 2023-12-10 DIAGNOSIS — O42913 Preterm premature rupture of membranes, unspecified as to length of time between rupture and onset of labor, third trimester: Secondary | ICD-10-CM | POA: Diagnosis present

## 2023-12-10 DIAGNOSIS — Z87891 Personal history of nicotine dependence: Secondary | ICD-10-CM

## 2023-12-10 DIAGNOSIS — R7303 Prediabetes: Secondary | ICD-10-CM | POA: Diagnosis present

## 2023-12-10 DIAGNOSIS — O99820 Streptococcus B carrier state complicating pregnancy: Secondary | ICD-10-CM | POA: Diagnosis not present

## 2023-12-10 DIAGNOSIS — O4202 Full-term premature rupture of membranes, onset of labor within 24 hours of rupture: Secondary | ICD-10-CM | POA: Diagnosis not present

## 2023-12-10 LAB — URINALYSIS, ROUTINE W REFLEX MICROSCOPIC
Bilirubin Urine: NEGATIVE
Glucose, UA: NEGATIVE mg/dL
Hgb urine dipstick: NEGATIVE
Ketones, ur: NEGATIVE mg/dL
Nitrite: NEGATIVE
Protein, ur: NEGATIVE mg/dL
Specific Gravity, Urine: 1.018 (ref 1.005–1.030)
pH: 6 (ref 5.0–8.0)

## 2023-12-10 LAB — COMPREHENSIVE METABOLIC PANEL WITH GFR
ALT: 14 U/L (ref 0–44)
AST: 18 U/L (ref 15–41)
Albumin: 2.5 g/dL — ABNORMAL LOW (ref 3.5–5.0)
Alkaline Phosphatase: 139 U/L — ABNORMAL HIGH (ref 38–126)
Anion gap: 10 (ref 5–15)
BUN: 5 mg/dL — ABNORMAL LOW (ref 6–20)
CO2: 19 mmol/L — ABNORMAL LOW (ref 22–32)
Calcium: 8.6 mg/dL — ABNORMAL LOW (ref 8.9–10.3)
Chloride: 106 mmol/L (ref 98–111)
Creatinine, Ser: 0.61 mg/dL (ref 0.44–1.00)
GFR, Estimated: >60 mL/min (ref >60)
Glucose, Bld: 101 mg/dL — ABNORMAL HIGH (ref 70–99)
Potassium: 3.2 mmol/L — ABNORMAL LOW (ref 3.5–5.1)
Sodium: 135 mmol/L (ref 135–145)
Total Bilirubin: 0.4 mg/dL (ref 0.0–1.2)
Total Protein: 6.3 g/dL — ABNORMAL LOW (ref 6.5–8.1)

## 2023-12-10 LAB — CBC
HCT: 32.3 % — ABNORMAL LOW (ref 36.0–46.0)
Hemoglobin: 10.2 g/dL — ABNORMAL LOW (ref 12.0–15.0)
MCH: 25.6 pg — ABNORMAL LOW (ref 26.0–34.0)
MCHC: 31.6 g/dL (ref 30.0–36.0)
MCV: 81.2 fL (ref 80.0–100.0)
Platelets: 312 K/uL (ref 150–400)
RBC: 3.98 MIL/uL (ref 3.87–5.11)
RDW: 13.9 % (ref 11.5–15.5)
WBC: 12.2 K/uL — ABNORMAL HIGH (ref 4.0–10.5)
nRBC: 0 % (ref 0.0–0.2)

## 2023-12-10 LAB — RPR: RPR Ser Ql: NONREACTIVE

## 2023-12-10 LAB — POCT FERN TEST: POCT Fern Test: POSITIVE

## 2023-12-10 LAB — TYPE AND SCREEN
ABO/RH(D): B POS
Antibody Screen: NEGATIVE

## 2023-12-10 LAB — GROUP B STREP BY PCR: Group B strep by PCR: POSITIVE — AB

## 2023-12-10 MED ORDER — SOD CITRATE-CITRIC ACID 500-334 MG/5ML PO SOLN: 30.0000 mL | ORAL | Status: DC | PRN

## 2023-12-10 MED ORDER — PENICILLIN G POT IN DEXTROSE 60000 UNIT/ML IV SOLN
3.0000 10*6.[IU] | INTRAVENOUS | Status: DC
  Administered 2023-12-10 – 2023-12-11 (×6): 3 10*6.[IU] via INTRAVENOUS
  Filled 2023-12-10 (×6): qty 50

## 2023-12-10 MED ORDER — ACETAMINOPHEN 325 MG PO TABS: 650.0000 mg | ORAL_TABLET | ORAL | Status: DC | PRN

## 2023-12-10 MED ORDER — LACTATED RINGERS IV SOLN
INTRAVENOUS | Status: DC
Start: 2023-12-10 — End: 2023-12-11

## 2023-12-10 MED ORDER — FENTANYL-BUPIVACAINE-NACL 0.5-0.125-0.9 MG/250ML-% EP SOLN
12.0000 mL/h | EPIDURAL | Status: DC | PRN
  Administered 2023-12-10: 12 mL/h via EPIDURAL
  Filled 2023-12-10: qty 250

## 2023-12-10 MED ORDER — LACTATED RINGERS IV SOLN: 500.0000 mL | INTRAVENOUS | Status: DC | PRN

## 2023-12-10 MED ORDER — ONDANSETRON HCL 4 MG/2ML IJ SOLN: 4.0000 mg | Freq: Four times a day (QID) | INTRAMUSCULAR | Status: DC | PRN

## 2023-12-10 MED ORDER — PHENYLEPHRINE 80 MCG/ML (10ML) SYRINGE FOR IV PUSH (FOR BLOOD PRESSURE SUPPORT): 80.0000 ug | PREFILLED_SYRINGE | INTRAVENOUS | Status: DC | PRN

## 2023-12-10 MED ORDER — SODIUM CHLORIDE 0.9 % IV SOLN
INTRAVENOUS | Status: DC | PRN
  Administered 2023-12-10: 5 mL via EPIDURAL

## 2023-12-10 MED ORDER — LIDOCAINE HCL (PF) 1 % IJ SOLN
INTRAMUSCULAR | Status: DC | PRN
  Administered 2023-12-10: 3 mL via SUBCUTANEOUS

## 2023-12-10 MED ORDER — OXYTOCIN-SODIUM CHLORIDE 30-0.9 UT/500ML-% IV SOLN
1.0000 m[IU]/min | INTRAVENOUS | Status: DC
  Administered 2023-12-10: 2 m[IU]/min via INTRAVENOUS
  Filled 2023-12-10: qty 500

## 2023-12-10 MED ORDER — OXYTOCIN-SODIUM CHLORIDE 30-0.9 UT/500ML-% IV SOLN
2.5000 [IU]/h | INTRAVENOUS | Status: DC
  Administered 2023-12-11: 2.5 [IU]/h via INTRAVENOUS

## 2023-12-10 MED ORDER — OXYCODONE-ACETAMINOPHEN 5-325 MG PO TABS: 1.0000 | ORAL_TABLET | ORAL | Status: DC | PRN

## 2023-12-10 MED ORDER — EPHEDRINE 5 MG/ML INJ: 10.0000 mg | INTRAVENOUS | Status: DC | PRN

## 2023-12-10 MED ORDER — FENTANYL CITRATE (PF) 100 MCG/2ML IJ SOLN
50.0000 ug | INTRAMUSCULAR | Status: DC | PRN
  Administered 2023-12-10: 100 ug via INTRAVENOUS
  Filled 2023-12-10: qty 2

## 2023-12-10 MED ORDER — LACTATED RINGERS IV SOLN: 500.0000 mL | Freq: Once | INTRAVENOUS | Status: DC

## 2023-12-10 MED ORDER — TERBUTALINE SULFATE 1 MG/ML IJ SOLN: 0.2500 mg | Freq: Once | INTRAMUSCULAR | Status: DC | PRN

## 2023-12-10 MED ORDER — MISOPROSTOL 50MCG HALF TABLET
50.0000 ug | ORAL_TABLET | Freq: Once | ORAL | Status: AC
  Administered 2023-12-10: 50 ug via ORAL
  Filled 2023-12-10: qty 1

## 2023-12-10 MED ORDER — LIDOCAINE HCL (PF) 1 % IJ SOLN: 30.0000 mL | INTRAMUSCULAR | Status: DC | PRN

## 2023-12-10 MED ORDER — TERBUTALINE SULFATE 1 MG/ML IJ SOLN
0.2500 mg | Freq: Once | INTRAMUSCULAR | Status: AC | PRN
  Administered 2023-12-10: 0.25 mg via SUBCUTANEOUS
  Filled 2023-12-10: qty 1

## 2023-12-10 MED ORDER — OXYTOCIN BOLUS FROM INFUSION
333.0000 mL | Freq: Once | INTRAVENOUS | Status: AC
  Administered 2023-12-11: 333 mL via INTRAVENOUS

## 2023-12-10 MED ORDER — LIDOCAINE-EPINEPHRINE (PF) 2 %-1:200000 IJ SOLN
INTRAMUSCULAR | Status: DC | PRN
  Administered 2023-12-10: 3 mL via EPIDURAL

## 2023-12-10 MED ORDER — OXYCODONE-ACETAMINOPHEN 5-325 MG PO TABS: 2.0000 | ORAL_TABLET | ORAL | Status: DC | PRN

## 2023-12-10 MED ORDER — DIPHENHYDRAMINE HCL 50 MG/ML IJ SOLN: 12.5000 mg | INTRAMUSCULAR | Status: DC | PRN

## 2023-12-10 MED ORDER — MISOPROSTOL 25 MCG QUARTER TABLET
25.0000 ug | ORAL_TABLET | Freq: Once | ORAL | Status: AC
Start: 2023-12-10 — End: 2023-12-10
  Administered 2023-12-10: 25 ug via VAGINAL
  Filled 2023-12-10: qty 1

## 2023-12-10 MED ORDER — PENICILLIN G POTASSIUM 5000000 UNITS IJ SOLR
5.0000 10*6.[IU] | Freq: Once | INTRAMUSCULAR | Status: AC
  Administered 2023-12-10: 5 10*6.[IU] via INTRAVENOUS
  Filled 2023-12-10: qty 5

## 2023-12-10 MED ORDER — MISOPROSTOL 50MCG HALF TABLET
50.0000 ug | ORAL_TABLET | Freq: Once | ORAL | Status: AC
Start: 2023-12-10 — End: 2023-12-10
  Administered 2023-12-10: 50 ug via ORAL
  Filled 2023-12-10: qty 1

## 2023-12-10 NOTE — MAU Note (Signed)
 Stacy Mcgee is a 28 y.o. at [redacted]w[redacted]d here in MAU reporting:   Pt states that she woke up in a puddle yesterday morning and then continued to slow leak through out the day but recently it continued to leak more. Clear. +FM. No bleeding.  Pt reports feeling ctxs every . Rating 6/10 pain.   Pt also states that she has noticed some ankle swelling.  No headache or blurry vision or epigastric pain.    Vitals:   12/10/23 0050  BP: 129/71  Pulse: (!) 118  Resp: 17  Temp: 98 F (36.7 C)     FHT: 149 Lab orders placed from triage: Odetta slide.

## 2023-12-10 NOTE — Progress Notes (Signed)
 Labor Progress Note Stacy Mcgee is a 28 y.o. G1P0 at [redacted]w[redacted]d presented for preterm premature rupture of membranes.  S: Doing well. Epidural in place. FB came out.   O:  BP 120/85   Pulse (!) 111   Temp 98.6 F (37 C) (Oral)   Resp 16   Ht 5' 3 (1.6 m)   Wt 108.2 kg   LMP 03/10/2023   SpO2 100%   BMI 42.25 kg/m  EFM: 150/Min/Mod/Max: Moderate Variability/Accelerations (-),Decelerations (-)  CVE: Dilation: 4 Effacement (%): 60 Station: -3 Presentation: Vertex Exam by:: Dr JAYSON Mcgee   A&P: 28 y.o. G1P0 [redacted]w[redacted]d  #Labor: Progressing well. FB out. Cervical check as above. Will start augmentation with pitocin  2x2. Recheck cervix in a few hours. Anticipate SVD.  #Pain: Epidural in place.  #FWB: Category I #GBS positive -> PCN  Stacy Stills [name], MD 9:23 PM

## 2023-12-10 NOTE — MAU Note (Signed)

## 2023-12-10 NOTE — Plan of Care (Signed)

## 2023-12-10 NOTE — H&P (Signed)
 OBSTETRIC ADMISSION HISTORY AND PHYSICAL  Stacy Mcgee is a 28 y.o. female G1P0 with IUP at [redacted]w[redacted]d (dated by [redacted]w[redacted]d US , Estimated Date of Delivery: 01/02/24) presenting for SROM.   She reports +FMs, no VB, no blurry vision, headaches or peripheral edema, and RUQ pain.    States she felt slow leaking starting the morning of 12/08/23. She then missed her appointment on 12/09/2023 at [facility] because she got the dates mixed up. The office called to reschedule and at that point she was told to come to [facility]U.   She plans on bottle feeding. She declines birth control  She received her prenatal care at [facility]   Prenatal History/Complications:   Prediabetes  Past Medical History: Past Medical History:  Diagnosis Date   Adult body mass index 36.0-36.9 10/17/2012   Allergic rhinitis 10/24/2012   STORY: hx of med in past.`E1o3L`IMPRESSION: rx to pharmacy, to call if worsening     Asthma    Asthma 10/24/2012   STORY: seen friday in ER. [place]. `E1o3L`steroid pack, given spacer for her ventolin . `E1o3L`feeling much better overall, has tight cough.`E1o3L`IMPRESSION: spirometry today shows mild obstruction. `E1o3L`cont qvar bid, use ventolin  prn qid. call prn`E1o3L`discussed avoiding allergens, no smoking in house. con't claritin. f/u for CPE     Atopic dermatitis and related condition 10/24/2012   IMPRESSION: Severe.  Has no dermatologist, and has been using just Eucerin cream which has proven ineffective.  Rx of Triamcinolone  0.5% to use with Eucerin BID for one month and follow up at that time.  Add Zyrtec daily for allergic component.     Eczema    Generalized headaches    Irregular menstrual cycle 10/24/2012   Migraine without aura and without status migrainosus, not intractable 03/04/2015   Obesity 10/24/2012   IMPRESSION: discussed portions, food choices, avoid sugar drinks, increase exercise     Problems influencing health status 10/24/2012   Screening examination for STI 10/07/2022   Seasonal  allergies    Sleeping difficulty 03/04/2015   Tension headache 03/04/2015    Past Surgical History: Past Surgical History:  Procedure Laterality Date   HAND SURGERY  02/2012    Obstetrical History: OB History     Gravida  1   Para      Term      Preterm      AB      Living         SAB      IAB      Ectopic      Multiple      Live Births              Social History Social History   Socioeconomic History   Marital status: Significant Other    Spouse name: Not on file   Number of children: Not on file   Years of education: Not on file   Highest education level: Not on file  Occupational History   Not on file  Tobacco Use   Smoking status: Former    Current packs/day: 0.00    Types: Cigarettes    Quit date: 05/11/2023    Years since quitting: 0.5   Smokeless tobacco: Never   Tobacco comments:    4 cigarettes per day  Vaping Use   Vaping status: Never Used  Substance and Sexual Activity   Alcohol use: Not Currently    Comment: occ   Drug use: Not Currently    Types: Marijuana    Comment: last use may 2025  Sexual activity: Yes    Birth control/protection: None  Other Topics Concern   Not on file  Social History Narrative   Jazma is in twelfth grade at [employer]. She is doing well.    Lives with her mother, younger brother, and younger sister. She has a 44 year old sister that does not live in the home.   Social Drivers of Corporate investment banker Strain: Not on file  Food Insecurity: Food Insecurity Present (07/16/2023)   Hunger Vital Sign    Worried About Running Out of Food in the Last Year: Sometimes true    Ran Out of Food in the Last Year: Sometimes true  Transportation Needs: No Transportation Needs (07/16/2023)   PRAPARE - Administrator, Civil Service (Medical): No    Lack of Transportation (Non-Medical): No  Physical Activity: Not on file  Stress: Not on file  Social Connections: Not on file     Family History: Family History  Problem Relation Age of Onset   Bipolar disorder Mother    Depression Mother    Migraines Father    ADD / ADHD Sister    Migraines Maternal Aunt    Cancer Maternal Grandfather    Heart Problems Paternal Grandfather     Allergies: Allergies  Allergen Reactions   Peanut-Containing Drug Products Anaphylaxis    Medications Prior to Admission  Medication Sig Dispense Refill Last Dose/Taking   Prenatal 27-1 MG TABS Take 1 tablet by mouth daily. 30 tablet 11 12/09/2023   Blood Pressure Monitoring (BLOOD PRESSURE KIT) DEVI 1 each by Does not apply route as needed. 1 each 0      Review of Systems  All systems reviewed and negative except as stated in HPI.  Blood pressure 129/71, pulse (!) 118, temperature 98 F (36.7 C), temperature source Oral, resp. rate 17, height 5' 3 (1.6 m), weight 108.2 kg, last menstrual period 03/10/2023. General appearance: alert, cooperative, and appears stated age Lungs: breathing comfortably on room air Heart: regular rate Abdomen: soft, non-tender; gravid Extremities: no edema of bilateral lower extremities DTR's intact Presentation: cephalic Fetal monitoringBaseline: 150 bpm, Variability: Good {> 6 bpm), Accelerations: Reactive, and Decelerations: Absent Uterine activityNone     Prenatal labs: ABO, Rh: --/--/B POS (09/16 0151) Antibody: NEG (09/16 0151) Rubella: 1.60 (04/22 1129) RPR: Non Reactive (04/22 1129)  HBsAg: Negative (04/22 1129)  HIV: Non Reactive (04/22 1129)  GBS: POSITIVE/-- (09/16 0122)  2 hr Glucola passed Genetic screening  NIPS: LR female, AFP normal Anatomy US  appears normal Last US : At [redacted]w[redacted]d - cephalic presentation, EFW 2151 (12 %tile), AC 13%  Prenatal Transfer Tool  Maternal Diabetes: No Genetic Screening: Normal Maternal Ultrasounds/Referrals: Normal Fetal Ultrasounds or other Referrals:  None Maternal Substance Abuse:  No Significant Maternal Medications:  None Significant  Maternal Lab Results:  Group B Strep positive Number of Prenatal Visits:greater than 3 verified prenatal visits Other Comments:  None  Results for orders placed or performed during the hospital encounter of 12/10/23 (from the past 24 hours)  Fern Test   Collection Time: 12/10/23  1:04 AM  Result Value Ref Range   POCT Fern Test Positive = ruptured amniotic membanes   Group B strep by PCR   Collection Time: 12/10/23  1:22 AM   Specimen: Vaginal/Rectal; Genital  Result Value Ref Range   Group B strep by PCR POSITIVE (A) PRESUMPTIVE NEGATIVE  CBC   Collection Time: 12/10/23  1:51 AM  Result Value Ref Range  WBC 12.2 (H) 4.0 - 10.5 K/uL   RBC 3.98 3.87 - 5.11 MIL/uL   Hemoglobin 10.2 (L) 12.0 - 15.0 g/dL   HCT 67.6 (L) 63.9 - 53.9 %   MCV 81.2 80.0 - 100.0 fL   MCH 25.6 (L) 26.0 - 34.0 pg   MCHC 31.6 30.0 - 36.0 g/dL   RDW 86.0 88.4 - 84.4 %   Platelets 312 150 - 400 K/uL   nRBC 0.0 0.0 - 0.2 %  Type and screen [facility]   Collection Time: 12/10/23  1:51 AM  Result Value Ref Range   ABO/RH(D) B POS    Antibody Screen NEG    Sample Expiration      12/13/2023,2359 Performed at [facility], [address], [place], [place] [address]     Patient Active Problem List   Diagnosis Date Noted   Obesity affecting pregnancy, antepartum 11/13/2023   Prediabetes 07/22/2023   Supervision of low-risk pregnancy 07/09/2023    Assessment/Plan:  Stacy Mcgee is a 28 y.o. G1P0 at [redacted]w[redacted]d here for SROM  #Labor:IOL explained in detail to the patient. Will start with dual cytotec . Recheck in about 4 hours. Consider FB, AROM, pitocin  in the future based on maternal and fetal wellbeing.  #Pain: Per patient request. Planning epidural #FWB: Cat I #ID:  GBS (+) -> PCN #MOF: Both #MOC:Undecided #Circ:  yes  Barkley Angles, MD OB Fellow, Faculty Practice [facility], Center for [facility] 12/10/2023 2:44 AM

## 2023-12-10 NOTE — Progress Notes (Signed)
 LABOR PROGRESS NOTE  Stacy Mcgee is a 28 y.o. G1P0 at [redacted]w[redacted]d  admitted for P PROM  Subjective: Comfortable and resting  Objective: BP 119/86   Pulse 85   Temp 98.7 F (37.1 C) (Oral)   Resp 18   Ht 5' 3 (1.6 m)   Wt 108.2 kg   LMP 03/10/2023   SpO2 100%   BMI 42.25 kg/m  or  Vitals:   12/10/23 0931 12/10/23 0935 12/10/23 1136 12/10/23 1300  BP: 122/72   119/86  Pulse: (!) 110   85  Resp:    18  Temp:   98.1 F (36.7 C) 98.7 F (37.1 C)  TempSrc:   Oral Oral  SpO2:  99%  100%  Weight:      Height:         Dilation: Fingertip Effacement (%): 80 Station: -3 Presentation: Vertex Exam by:: Padgett RN FHT: baseline rate 145, moderate varibility, + acel, no decel Toco: Every 2 to 3-1/2 minutes  Labs: Lab Results  Component Value Date   WBC 12.2 (H) 12/10/2023   HGB 10.2 (L) 12/10/2023   HCT 32.3 (L) 12/10/2023   MCV 81.2 12/10/2023   PLT 312 12/10/2023    Patient Active Problem List   Diagnosis Date Noted   Antepartum premature rupture of membrane 12/10/2023   Obesity affecting pregnancy, antepartum 11/13/2023   Prediabetes 07/22/2023   Supervision of low-risk pregnancy 07/09/2023    Assessment / Plan: 28 y.o. G1P0 at [redacted]w[redacted]d here for P PROM  Labor: Slow progression, s/p Cytotec  x 1.  Discussed Foley balloon placement and patient agreeable.  Foley balloon placed without difficulty.  Will redose Cytotec  Fetal Wellbeing: Category 1, reassuring Pain Control: Per patient request Anticipated MOD: Vaginal delivery  Steffan Rover, MD Attending Family Medicine Physician, [facility] for [facility], [facility] [facility]  12/10/2023 2:40 PM

## 2023-12-10 NOTE — Anesthesia Preprocedure Evaluation (Signed)
 Anesthesia Evaluation  Patient identified by MRN, date of birth, ID band Patient awake    Reviewed: Allergy & Precautions, NPO status , Patient's Chart, lab work & pertinent test results  History of Anesthesia Complications Negative for: history of anesthetic complications  Airway Mallampati: III  TM Distance: >3 FB Neck ROM: Full    Dental no notable dental hx. (+) Teeth Intact   Pulmonary asthma , neg sleep apnea, neg COPD, Patient abstained from smoking.Not current smoker, former smoker Mild intermittent asthma, flares up with illnesses. Last hospitalized as a child. Boyfriend smokes.   Pulmonary exam normal breath sounds clear to auscultation       Cardiovascular Exercise Tolerance: Good METS(-) hypertension(-) CAD and (-) Past MI negative cardio ROS (-) dysrhythmias  Rhythm:Regular Rate:Normal - Systolic murmurs    Neuro/Psych  Headaches  negative psych ROS   GI/Hepatic ,neg GERD  ,,(+)     (-) substance abuse    Endo/Other  neg diabetes  Class 3 obesity  Renal/GU negative Renal ROS     Musculoskeletal   Abdominal  (+) + obese  Peds  Hematology Denies blood thinner use or bleeding disorders.    Anesthesia Other Findings Denies blood thinner use or bleeding diatheses. Recent labs reviewed. Past Medical History: 10/17/2012: Adult body mass index 36.0-36.9 10/24/2012: Allergic rhinitis     Comment:  STORY: hx of med in past.`E1o3L`IMPRESSION: rx to               pharmacy, to call if worsening   No date: Asthma 10/24/2012: Asthma     Comment:  STORY: seen friday in ER. [place]. `E1o3L`steroid               pack, given spacer for her ventolin . `E1o3L`feeling much               better overall, has tight cough.`E1o3L`IMPRESSION:               spirometry today shows mild obstruction. `E1o3L`cont qvar              bid, use ventolin  prn qid. call prn`E1o3L`discussed               avoiding allergens, no  smoking in house. con't claritin.               f/u for CPE   10/24/2012: Atopic dermatitis and related condition     Comment:  IMPRESSION: Severe.  Has no dermatologist, and has been               using just Eucerin cream which has proven ineffective.                Rx of Triamcinolone  0.5% to use with Eucerin BID for one               month and follow up at that time.  Add Zyrtec daily for               allergic component.   No date: Eczema No date: Generalized headaches 10/24/2012: Irregular menstrual cycle 03/04/2015: Migraine without aura and without status migrainosus, not  intractable 10/24/2012: Obesity     Comment:  IMPRESSION: discussed portions, food choices, avoid               sugar drinks, increase exercise   10/24/2012: Problems influencing health status 10/07/2022: Screening examination for STI No date: Seasonal allergies 03/04/2015: Sleeping difficulty 03/04/2015: Tension headache   Reproductive/Obstetrics (+) Pregnancy  Anesthesia Physical Anesthesia Plan  ASA: 3  Anesthesia Plan: Epidural   Post-op Pain Management:    Induction:   PONV Risk Score and Plan: 2 and Treatment may vary due to age or medical condition and Ondansetron   Airway Management Planned: Natural Airway  Additional Equipment:   Intra-op Plan:   Post-operative Plan:   Informed Consent: I have reviewed the patients History and Physical, chart, labs and discussed the procedure including the risks, benefits and alternatives for the proposed anesthesia with the patient or authorized representative who has indicated his/her understanding and acceptance.       Plan Discussed with: Surgeon  Anesthesia Plan Comments: (Discussed R/B/A of neuraxial anesthesia technique with patient: - rare risks of spinal/epidural hematoma, nerve damage, infection - Risk of PDPH - Risk of itching - Risk of nausea and vomiting - Risk of poor block  necessitating replacement of epidural. - Risk of allergic reactions. Patient voiced understanding.)        Anesthesia Quick Evaluation

## 2023-12-10 NOTE — Anesthesia Procedure Notes (Signed)
 Epidural Patient location during procedure: OB Start time: 12/10/2023 3:05 PM End time: 12/10/2023 3:13 PM  Staffing Anesthesiologist: Boone Fess, MD Performed: anesthesiologist   Preanesthetic Checklist Completed: patient identified, IV checked, site marked, risks and benefits discussed, surgical consent, monitors and equipment checked, pre-op evaluation and timeout performed  Epidural Patient position: sitting Prep: ChloraPrep Patient monitoring: heart rate, continuous pulse ox and blood pressure Approach: midline Location: L3-L4 Injection technique: LOR saline  Needle:  Needle type: Tuohy  Needle gauge: 17 G Needle length: 9 cm Needle insertion depth: 5 cm Catheter type: closed end flexible Catheter size: 19 Gauge Catheter at skin depth: 10 cm Test dose: negative and 1.5% lidocaine  with Epi 1:200 K  Assessment Sensory level: T10 Events: blood not aspirated, no cerebrospinal fluid, injection not painful, no injection resistance, no paresthesia and negative IV test  Additional Notes First/one attempt Pt. Evaluated and documentation done after procedure finished. Patient identified. Risks/Benefits/Options discussed with patient including but not limited to bleeding, infection, nerve damage, paralysis, failed block, incomplete pain control, headache, blood pressure changes, nausea, vomiting, reactions to medication both or allergic, itching and postpartum back pain. Confirmed with bedside nurse the patient's most recent platelet count. Confirmed with patient that they are not currently taking any anticoagulation, have any bleeding history or any family history of bleeding disorders. Patient expressed understanding and wished to proceed. All questions were answered. Sterile technique was used throughout the entire procedure. Please see nursing notes for vital signs. Test dose was given through epidural catheter and negative prior to continuing to dose epidural or start infusion.  Warning signs of high block given to the patient including shortness of breath, tingling/numbness in hands, complete motor block, or any concerning symptoms with instructions to call for help. Patient was given instructions on fall risk and not to get out of bed. All questions and concerns addressed with instructions to call with any issues or inadequate analgesia.     Patient tolerated the insertion well without immediate complications.  Reason for block: procedure for painReason for block:procedure for pain

## 2023-12-11 ENCOUNTER — Encounter (HOSPITAL_COMMUNITY): Payer: Self-pay | Admitting: Obstetrics & Gynecology

## 2023-12-11 DIAGNOSIS — O4202 Full-term premature rupture of membranes, onset of labor within 24 hours of rupture: Secondary | ICD-10-CM

## 2023-12-11 DIAGNOSIS — O99214 Obesity complicating childbirth: Secondary | ICD-10-CM

## 2023-12-11 DIAGNOSIS — Z3A36 36 weeks gestation of pregnancy: Secondary | ICD-10-CM

## 2023-12-11 DIAGNOSIS — O99820 Streptococcus B carrier state complicating pregnancy: Secondary | ICD-10-CM

## 2023-12-11 MED ORDER — ERYTHROMYCIN 5 MG/GM OP OINT
TOPICAL_OINTMENT | OPHTHALMIC | Status: AC
  Filled 2023-12-11: qty 1

## 2023-12-11 MED ORDER — SODIUM CHLORIDE 0.9 % IV SOLN: 250.0000 mL | INTRAVENOUS | Status: DC | PRN

## 2023-12-11 MED ORDER — ACETAMINOPHEN 325 MG PO TABS: 650.0000 mg | ORAL_TABLET | ORAL | Status: DC | PRN

## 2023-12-11 MED ORDER — IBUPROFEN 800 MG PO TABS
800.0000 mg | ORAL_TABLET | Freq: Three times a day (TID) | ORAL | Status: DC
  Administered 2023-12-11 (×2): 800 mg via ORAL
  Filled 2023-12-11 (×5): qty 1

## 2023-12-11 MED ORDER — TETANUS-DIPHTH-ACELL PERTUSSIS 5-2.5-18.5 LF-MCG/0.5 IM SUSY: 0.5000 mL | PREFILLED_SYRINGE | Freq: Once | INTRAMUSCULAR | Status: DC

## 2023-12-11 MED ORDER — WITCH HAZEL-GLYCERIN EX PADS: 1.0000 | MEDICATED_PAD | CUTANEOUS | Status: DC | PRN

## 2023-12-11 MED ORDER — SODIUM CHLORIDE 0.9% FLUSH: 3.0000 mL | INTRAVENOUS | Status: DC | PRN

## 2023-12-11 MED ORDER — ONDANSETRON HCL 4 MG/2ML IJ SOLN: 4.0000 mg | INTRAMUSCULAR | Status: DC | PRN

## 2023-12-11 MED ORDER — ONDANSETRON HCL 4 MG PO TABS: 4.0000 mg | ORAL_TABLET | ORAL | Status: DC | PRN

## 2023-12-11 MED ORDER — OXYCODONE HCL 5 MG PO TABS: 5.0000 mg | ORAL_TABLET | Freq: Four times a day (QID) | ORAL | Status: DC | PRN

## 2023-12-11 MED ORDER — DIBUCAINE (PERIANAL) 1 % EX OINT: 1.0000 | TOPICAL_OINTMENT | CUTANEOUS | Status: DC | PRN

## 2023-12-11 MED ORDER — SIMETHICONE 80 MG PO CHEW: 80.0000 mg | CHEWABLE_TABLET | ORAL | Status: DC | PRN

## 2023-12-11 MED ORDER — PRENATAL MULTIVITAMIN CH
1.0000 | ORAL_TABLET | Freq: Every day | ORAL | Status: DC
Start: 2023-12-11 — End: 2023-12-13
  Administered 2023-12-11 – 2023-12-13 (×3): 1 via ORAL
  Filled 2023-12-11 (×3): qty 1

## 2023-12-11 MED ORDER — COCONUT OIL OIL: 1.0000 | TOPICAL_OIL | Status: DC | PRN

## 2023-12-11 MED ORDER — MEASLES, MUMPS & RUBELLA VAC IJ SOLR: 0.5000 mL | Freq: Once | INTRAMUSCULAR | Status: DC

## 2023-12-11 MED ORDER — SODIUM CHLORIDE 0.9% FLUSH
3.0000 mL | Freq: Two times a day (BID) | INTRAVENOUS | Status: DC
  Administered 2023-12-11: 3 mL via INTRAVENOUS

## 2023-12-11 MED ORDER — SENNOSIDES-DOCUSATE SODIUM 8.6-50 MG PO TABS
2.0000 | ORAL_TABLET | ORAL | Status: DC
  Administered 2023-12-11: 2 via ORAL
  Filled 2023-12-11 (×3): qty 2

## 2023-12-11 MED ORDER — BENZOCAINE-MENTHOL 20-0.5 % EX AERO: 1.0000 | INHALATION_SPRAY | CUTANEOUS | Status: DC | PRN

## 2023-12-11 MED ORDER — DIPHENHYDRAMINE HCL 25 MG PO CAPS: 25.0000 mg | ORAL_CAPSULE | Freq: Four times a day (QID) | ORAL | Status: DC | PRN

## 2023-12-11 NOTE — Discharge Summary (Signed)
 Postpartum Discharge Summary  Date of Service updated***     Patient Name: Stacy Mcgee DOB: 1995-11-16 MRN: [id number]  Date of admission: 12/10/2023 Delivery date:12/11/2023 Delivering provider: MAGALI BARKLEY CROME Date of discharge: 12/11/2023  Admitting diagnosis: Antepartum premature rupture of membrane [O42.90] Intrauterine pregnancy: [redacted]w[redacted]d     Secondary diagnosis:  Principal Problem:   Antepartum premature rupture of membrane  Additional problems: prediabetes (no GDM), obesity    Discharge diagnosis: Preterm Pregnancy Delivered                                              Post partum procedures:{Postpartum procedures:23558} Augmentation: AROM and Pitocin  Complications: None  Hospital course: Onset of Labor With Vaginal Delivery      28 y.o. yo G1P0 at [redacted]w[redacted]d was admitted in Latent Labor on 12/10/2023. Labor course was uncomplicated Membrane Rupture Time/Date: 10:00 AM,12/08/2023  Delivery Method:Vaginal, Spontaneous Operative Delivery:N/A Episiotomy: None Lacerations:  None Patient had a postpartum course complicated by ***.  She is ambulating, tolerating a regular diet, passing flatus, and urinating well. Patient is discharged home in stable condition on 12/11/23.  Newborn Data: Birth date:12/11/2023 Birth time:6:30 AM Gender:Female Living status:Living Apgars:8 ,9  Weight:   Magnesium Sulfate received: {Mag received:30440022} BMZ received: No Rhophylac:N/A Rh(+) MMR:N/A Immune T-DaP:{Tdap:23962} Flu: No RSV Vaccine received: No Transfusion:{Transfusion received:30440034}  Immunizations received: Immunization History  Administered Date(s) Administered   DTaP 12/27/1997, 05/25/1998, 10/10/1998, 04/28/1999, 02/27/2002   Fluzone Influenza virus vaccine,trivalent (IIV3), split virus 01/28/2003, 12/23/2007, 04/17/2011, 02/20/2012   HIB (PRP-OMP) 12/27/1997, 05/25/1998, 10/10/1998, 04/28/1999, 02/27/2002   HPV 9-valent 03/24/2015, 09/22/2015   HPV  Quadrivalent 04/17/2011, 10/19/2011   Hepatitis A, Ped/Adol-2 Dose 11/30/2005, 06/23/2007   Hepatitis B, PED/ADOLESCENT 05/25/1998, 10/10/1998, 02/13/1999   IPV 12/27/1997, 05/25/1998, 10/10/1998, 02/27/2002   MMR 02/13/1999, 04/28/1999, 02/25/2002   Meningococcal Conjugate 04/17/2011, 03/24/2015   Tdap 12/23/2007, 03/03/2023   Tetanus 12/23/2007   Varicella 02/13/1999, 08/07/1999, 11/30/2005    Physical exam  Vitals:   12/11/23 0401 12/11/23 0430 12/11/23 0500 12/11/23 0530  BP: (!) 144/93  134/83 (!) 142/97  Pulse: 69  92 88  Resp: 17  18   Temp:   98.4 F (36.9 C)   TempSrc:   Axillary   SpO2:  100% 100% 100%  Weight:      Height:       General: {Exam; general:21111117} Lochia: {Desc; appropriate/inappropriate:30686::appropriate} Uterine Fundus: {Desc; firm/soft:30687} Incision: {Exam; incision:21111123} DVT Evaluation: {Exam; dvt:2111122} Labs: Lab Results  Component Value Date   WBC 12.2 (H) 12/10/2023   HGB 10.2 (L) 12/10/2023   HCT 32.3 (L) 12/10/2023   MCV 81.2 12/10/2023   PLT 312 12/10/2023      Latest Ref Rng & Units 12/10/2023    1:51 AM  CMP  Glucose 70 - 99 mg/dL 898   BUN 6 - 20 mg/dL 5   Creatinine 9.55 - 8.99 mg/dL 9.38   Sodium 864 - 854 mmol/L 135   Potassium 3.5 - 5.1 mmol/L 3.2   Chloride 98 - 111 mmol/L 106   CO2 22 - 32 mmol/L 19   Calcium  8.9 - 10.3 mg/dL 8.6   Total Protein 6.5 - 8.1 g/dL 6.3   Total Bilirubin 0.0 - 1.2 mg/dL 0.4   Alkaline Phos 38 - 126 U/L 139   AST 15 - 41 U/L 18   ALT 0 -  44 U/L 14    Edinburgh Score:     No data to display         No data recorded  After visit meds:  Allergies as of 12/11/2023       Reactions   Peanut-containing Drug Products Anaphylaxis     Med Rec must be completed prior to using this [facility]***        Discharge home in stable condition Infant Feeding: {Baby feeding:23562} Infant Disposition:{CHL IP OB HOME WITH FNUYZM:76418} Discharge instruction: per After Visit  Summary and Postpartum booklet. Activity: Advance as tolerated. Pelvic rest for 6 weeks.  Diet: {OB ipzu:78888878} Future Appointments: Future Appointments  Date Time Provider Department Center  12/16/2023  3:15 PM Eldonna Suzen Octave, MD [facility]  12/23/2023 10:35 AM Ilean, Norleen GAILS, MD [facility]   Follow up Visit:   Please schedule this patient for a In person postpartum visit in 6 weeks with the following provider: Any provider. Additional Postpartum F/U:N/A  Low risk pregnancy complicated by: N/A Delivery mode:  Vaginal, Spontaneous Anticipated Birth Control:  Unsure  Message sent 12/11/23 - CC   12/11/2023 Col[place] [name], MD

## 2023-12-11 NOTE — Lactation Note (Signed)
 This note was copied from a baby's chart. Lactation Consultation Note  Patient Name: Stacy Mcgee Unijb'd Date: 12/11/2023 Age:26 hours Reason for consult: Initial assessment  MOB is formula feeding only. Please let LC team know if she is in need of assistance at any time.  Feeding Mother's Current Feeding Choice: Formula Nipple Type: Slow - flow  Consult Status Consult Status: Complete    Recardo Hoit BS, IBCLC 12/11/2023, 3:56 PM

## 2023-12-11 NOTE — Anesthesia Postprocedure Evaluation (Signed)
 Anesthesia Post Note  Patient: Stacy Mcgee  Procedure(s) Performed: AN AD HOC LABOR EPIDURAL     Patient location during evaluation: Mother Baby Anesthesia Type: Epidural Level of consciousness: awake, awake and alert and oriented Pain management: satisfactory to patient Vital Signs Assessment: post-procedure vital signs reviewed and stable Respiratory status: spontaneous breathing, nonlabored ventilation and respiratory function stable Cardiovascular status: blood pressure returned to baseline and stable Postop Assessment: no backache, no headache, patient able to bend at knees, adequate PO intake, no apparent nausea or vomiting and able to ambulate Anesthetic complications: no   No notable events documented.  Last Vitals:  Vitals:   12/11/23 1332 12/11/23 1755  BP: 124/68 129/70  Pulse: 78 75  Resp: 18 18  Temp: 36.7 C 36.8 C  SpO2: 100% 100%    Last Pain:  Vitals:   12/11/23 1911  TempSrc:   PainSc: 0-No pain   Pain Goal:                   Koston Hennes

## 2023-12-11 NOTE — Progress Notes (Signed)
 Labor Progress Note Stacy Mcgee is a 28 y.o. G1P0 at [redacted]w[redacted]d presented for preterm premature rupture of membranes.  S: Doing well.  O:  BP (!) 144/93   Pulse 69   Temp 98.1 F (36.7 C) (Axillary)   Resp 17   Ht 5' 3 (1.6 m)   Wt 108.2 kg   LMP 03/10/2023   SpO2 100%   BMI 42.25 kg/m  EFM: 135/Min/Mod/Max: Moderate Variability/Accelerations (+),Decelerations (-)  CVE: Dilation: 4 Effacement (%): 80 Station: -2 Presentation: Vertex (confirmed by US ) Exam by:: JINNY Bihari RN   A&P: 28 y.o. G1P0 [redacted]w[redacted]d  #Labor: Progressing well. On cervical check baby found to be ballotable with large forebag present. While checking cervix, rupture of forebag occurred with clear fluid. Provider left hand in vaginal while lowering baby down to cervix and baby found to be well applied. No cord palpated. Reactive tracing as above. #Pain: Epidural in place.  #FWB: Category I #GBS positive -> PCN  Evana Runnels [name], MD 4:16 AM

## 2023-12-12 NOTE — Progress Notes (Signed)
 Post Partum Day # 1 Subjective: no complaints, up ad lib, voiding, tolerating PO, and + flatus  Objective: Blood pressure 134/68, pulse 78, temperature 98 F (36.7 C), temperature source Oral, resp. rate 17, height 5' 3 (1.6 m), weight 108.2 kg, last menstrual period 03/10/2023, SpO2 100%, unknown if currently breastfeeding.  Physical Exam:  General: alert, cooperative, and appears stated age Lochia: appropriate Uterine Fundus: firm Incision: healing well DVT Evaluation: No evidence of DVT seen on physical exam.  Recent Labs    12/10/23 0151  HGB 10.2*  HCT 32.3*    Assessment/Plan: Plan for discharge tomorrow   LOS: 2 days   Shanetta Nicolls N Zelma Snead, Student-MidWife 12/12/2023, 8:01 AM

## 2023-12-12 NOTE — Patient Instructions (Signed)

## 2023-12-12 NOTE — Clinical Social Work Maternal (Signed)
 CLINICAL SOCIAL WORK MATERNAL/CHILD NOTE  Patient Details  Name: Stacy Mcgee MRN: [id number] Date of Birth: 31-Jul-1995  Date:  12/12/2023  Clinical Social Worker Initiating Note:  Lemmie Vanlanen Date/Time: Initiated:  12/12/23/1536     Child's Name:  Stacy Mcgee 12/11/2023   Biological Parents:  Mother, Father Stacy Mcgee 04/04/97, Stacy Mcgee 10/20/1994)   Need for Interpreter:  None   Reason for Referral:  Current Substance Use/Substance Use During Pregnancy     Address:  [address] [place] [address]    Phone number:  [phone] (home)     Additional phone number:   Household Members/Support Persons (HM/SP):   Household Member/Support Person 1   HM/SP Name Relationship DOB or Age  HM/SP -1 Taheem Dixon significant other 10/20/1994  HM/SP -2        HM/SP -3        HM/SP -4        HM/SP -5        HM/SP -6        HM/SP -7        HM/SP -8          Natural Supports (not living in the home):  Parent   Professional Supports: None   Employment: Full-time   Type of Work: [employer]   Education:  [employer] school graduate   Homebound arranged:    Surveyor, quantity Resources:  Medicaid   Other Resources:  Sales executive  , WIC   Cultural/Religious Considerations Which May Impact Care:    Strengths:  Ability to meet basic needs  , Home prepared for child  , Pediatrician chosen   Psychotropic Medications:         Pediatrician:    Armed forces operational officer area  Pediatrician List:   [facility]  [place]    [place]    [place]      Pediatrician Fax Number:    Risk Factors/Current Problems:  None   Cognitive State:  Able to Concentrate  , Alert     Mood/Affect:  Calm  , Comfortable     CSW Assessment: CSW received a consult for THC use during pregnancy. CSW met with MOB to complete assessment and offer support. CSW entered the room and observed MOB resting in bed  holding the infant. CSW introduced self, CSW role and reason for visit. MOB was agreeable to visit. CSW inquired about how MOB was feeling, MOB reported good. CSW  confirmed MOB demographic information, MOB reported the information on file was correct. CSW inquired about MOB MH hx MOB denied any MH concerns.  CSW provided education regarding the baby blues period vs. perinatal mood disorders, discussed treatment and Mcgee resources for mental health follow up if concerns arise.  CSW recommends self-evaluation during the postpartum time period using the New Mom Checklist from Postpartum Progress and encouraged MOB to contact a medical professional if symptoms are noted at any time. MOB identified FOB and her dad as her primary supports.    CSW inquired about noted THC use MOB reported she stopped using once she found out she was pregnant. CSW informed MOB of the hospital drug screen policy, MOB verbalized understanding. CSW informed MOB infants UDS was negative and CDS was pending, MOB verbalized understanding.   CSW provided review of Sudden Infant Death Syndrome (SIDS) precautions.  MOB reported she has all essential items for the infant including a crib and car seat.  CSW identifies no further need for intervention and no barriers to discharge at this time. CSW Plan/Description:  No Further Intervention Required/No Barriers to Discharge, Sudden Infant Death Syndrome (SIDS) Education, Perinatal Mood and Anxiety Disorder (PMADs) Education, CSW Will Continue to Monitor Umbilical Cord Tissue Drug Screen Results and Make Report if [facility] Drug Screen Policy Information    Stacy Mcgee, [place] 12/12/2023, 3:39 PM

## 2023-12-13 MED ORDER — IBUPROFEN 800 MG PO TABS: 800.0000 mg | ORAL_TABLET | Freq: Three times a day (TID) | ORAL | Status: DC

## 2023-12-13 MED ORDER — ACETAMINOPHEN 325 MG PO TABS: 650.0000 mg | ORAL_TABLET | ORAL | Status: DC | PRN

## 2023-12-16 ENCOUNTER — Encounter: Admitting: Family Medicine

## 2023-12-16 ENCOUNTER — Encounter: Payer: Self-pay | Admitting: Family Medicine

## 2023-12-23 ENCOUNTER — Telehealth (HOSPITAL_COMMUNITY): Payer: Self-pay | Admitting: *Deleted

## 2023-12-23 ENCOUNTER — Encounter: Admitting: Family Medicine

## 2023-12-23 NOTE — Telephone Encounter (Signed)
 12/23/2023  Name: MONICE LUNDY MRN: [id number] DOB: [birth year]  Reason for Call:  Transition of Care Hospital Discharge Call  Contact Status: Patient Contact Status: Message  Language assistant needed:          Follow-Up Questions:    Van Postnatal Depression Scale:  In the Past 7 Days:    PHQ2-9 Depression Scale:     Discharge Follow-up:    Post-discharge interventions: NA  Mar Zettler,RN  12/23/2023 1627

## 2024-01-02 ENCOUNTER — Encounter: Payer: Self-pay | Admitting: Family Medicine

## 2024-01-10 ENCOUNTER — Encounter: Payer: Self-pay | Admitting: Family Medicine

## 2024-01-10 ENCOUNTER — Ambulatory Visit: Admitting: Family Medicine

## 2024-01-10 DIAGNOSIS — Z3009 Encounter for other general counseling and advice on contraception: Secondary | ICD-10-CM | POA: Diagnosis not present

## 2024-01-10 NOTE — Progress Notes (Signed)
 Post Partum Visit Note  Stacy Mcgee is a 28 y.o. G65P0101 female who presents for a postpartum visit. She is 4 weeks postpartum following a normal spontaneous vaginal delivery.  I have fully reviewed the prenatal and intrapartum course. The delivery was at [redacted]w[redacted]d gestational weeks.  Anesthesia: epidural. Postpartum course has been overall okay. Had severe swelling in hands and feet for the 1st 2 weeks, has gone down now. Baby is doing well. Baby is feeding by bottle - Similac Neosure. Bleeding no bleeding. Bowel function is normal. Bladder function is normal. Patient is sexually active. Contraception method is has questions about her options. Postpartum depression screening: negative.   Upstream - 01/10/24 1018       Pregnancy Intention Screening   Does the patient want to become pregnant in the next year? No    Does the patient's partner want to become pregnant in the next year? No    Would the patient like to discuss contraceptive options today? Yes      Contraception Wrap Up   Current Method No Contraceptive Precautions    End Method Oral Contraceptive    Contraception Counseling Provided Yes    How was the end contraceptive method provided? Provided on site         The pregnancy intention screening data noted above was reviewed. Potential methods of contraception were discussed. The patient elected to proceed with Oral Contraceptive.   Edinburgh Postnatal Depression Scale - 01/10/24 1023       Edinburgh Postnatal Depression Scale:  In the Past 7 Days   I have been able to laugh and see the funny side of things. 0    I have looked forward with enjoyment to things. 0    I have blamed myself unnecessarily when things went wrong. 0    I have been anxious or worried for no good reason. 0    I have felt scared or panicky for no good reason. 0    I have been so unhappy that I have had difficulty sleeping. 0    I have felt sad or miserable. 0    I have been so unhappy that I  have been crying. 0    The thought of harming myself has occurred to me. 0          Health Maintenance Due  Topic Date Due   Influenza Vaccine  10/25/2023   COVID-19 Vaccine (1 - 2025-26 season) Never done    The following portions of the patient's history were reviewed and updated as appropriate: allergies, current medications, past family history, past medical history, past social history, past surgical history, and problem list.  Review of Systems Pertinent items noted in HPI and remainder of comprehensive ROS otherwise negative.  Objective:  BP 109/71   Pulse 76   Wt 217 lb 8 oz (98.7 kg)   Breastfeeding No   BMI 38.53 kg/m    General:  alert, cooperative, and appears stated age   Breasts:  not indicated  Lungs: Comfortalbe on room air  Wound N/a  GU exam:  not indicated        Assessment:   Postpartum care and examination  Encounter for counseling regarding contraception  Normal postpartum exam.   Plan:   Essential components of care per ACOG recommendations:  1.  Mood and well being: Patient with negative depression screening today. Reviewed local resources for support.  - Patient tobacco use? No.   - hx of drug use?  No.    2. Infant care and feeding:  -Patient currently breastmilk feeding? No.  -Social determinants of health (SDOH) reviewed in EPIC. No concerns  3. Sexuality, contraception and birth spacing - Patient does not want a pregnancy in the next year.  Desired family size is ~3 children.  - Reviewed reproductive life planning. Reviewed contraceptive methods based on pt preferences and effectiveness.  Patient desired IUD insertion after counseling. Unprotected sex last week and four days ago, will set visit for 2 weeks from now for insertion.  Discussed no unprotected sex between now and then.  - Discussed birth spacing of 18 months  4. Sleep and fatigue -Encouraged family/partner/community support of 4 hrs of uninterrupted sleep to help with  mood and fatigue  5. Physical Recovery  - Discussed patients delivery and complications. She describes her labor as mixed. - Patient had a Vaginal, no problems at delivery. Patient had no laceration. Perineal healing reviewed. Patient expressed understanding - Patient has urinary incontinence? No. - Patient is safe to resume physical and sexual activity  6.  Health Maintenance - HM due items addressed No - up to date - Last pap smear  Diagnosis  Date Value Ref Range Status  07/16/2023   Final   - Negative for intraepithelial lesion or malignancy (NILM)   Pap smear not done at today's visit.  -Breast Cancer screening indicated? No.   7. Chronic Disease/Pregnancy Condition follow up: None 1. Postpartum care and examination   2. Encounter for counseling regarding contraception     - PCP follow up   Donnice CHRISTELLA Carolus, MD/MPH Attending Family Medicine Physician, [facility], [facility]

## 2024-01-20 ENCOUNTER — Ambulatory Visit

## 2024-01-24 ENCOUNTER — Encounter: Admitting: Family Medicine

## 2024-01-27 ENCOUNTER — Encounter: Payer: Self-pay | Admitting: Radiology

## 2024-01-28 ENCOUNTER — Encounter: Payer: Self-pay | Admitting: Nurse Practitioner

## 2024-01-28 ENCOUNTER — Telehealth: Admitting: Nurse Practitioner

## 2024-01-28 DIAGNOSIS — L309 Dermatitis, unspecified: Secondary | ICD-10-CM

## 2024-01-31 ENCOUNTER — Encounter: Payer: Self-pay | Admitting: Nurse Practitioner

## 2024-01-31 NOTE — Progress Notes (Signed)
 No show during visit time

## 2024-02-03 ENCOUNTER — Encounter: Payer: Self-pay | Admitting: Family Medicine

## 2024-02-04 ENCOUNTER — Telehealth: Admitting: Nurse Practitioner

## 2024-02-04 NOTE — Progress Notes (Deleted)
   Acute Video Visit    Virtual Visit Consent:   Stacy Mcgee, you are scheduled for a virtual visit with a [facility] provider today.     Just as with appointments in the office, your consent must be obtained to participate.  Your consent will be active for this visit and any virtual visit you may have with one of our providers in the next 365 days.     If you have a MyChart account, a copy of this consent can be sent to you electronically.  All virtual visits are billed to your insurance company just like a traditional visit in the office.    If the connection with a video visit is poor, the visit may have to be switched to a telephone visit.  With either a video or telephone visit, we are not always able to ensure that we have a secure connection.     I need to obtain your verbal consent now.   Are you willing to proceed with your visit today?    Stacy Mcgee has provided verbal consent on 02/04/2024 for a virtual visit (video or telephone).   Lauraine Kitty, FNP  Date: 02/04/2024 1:25 PM  Subjective:     Patient ID: Stacy Mcgee, female    DOB: 10/25/1995, 28 y.o.   MRN: [id number]  Stacy Mcgee Lauraine Kitty, connected with  Stacy Mcgee  ([id number], [birth year]) on 02/04/24 at  1:40 PM EST by a video-enabled telemedicine application and verified that I am speaking with the correct person using two identifiers.   Location: Patient: Home  Provider: Virtual Visit Location Provider: Home Office   I discussed the limitations of evaluation and management by telemedicine and the availability of in person appointments. The patient expressed understanding and agreed to proceed.      HPI Stacy Mcgee is a 28 y.o. who identifies as a female who was assigned female at birth, and is being seen today for complaints over worsening eczema .        Objective:      Physical Exam       Assessment & Plan:     Follow Up Instructions: I discussed the  assessment and treatment plan with the patient. The patient was provided an opportunity to ask questions and all were answered. The patient agreed with the plan and demonstrated an understanding of the instructions.  A copy of instructions were sent to the patient via MyChart unless otherwise noted below.    The patient was advised to call back or seek an in-person evaluation if the symptoms worsen or if the condition fails to improve as anticipated.    Lauraine Kitty, FNP  **Disclaimer: This note may have been dictated with voice recognition software. Similar sounding words can inadvertently be transcribed and this note may contain transcription errors which may not have been corrected upon publication of note.**

## 2024-02-07 ENCOUNTER — Encounter: Payer: Self-pay | Admitting: Family Medicine

## 2024-02-12 ENCOUNTER — Ambulatory Visit

## 2024-03-20 ENCOUNTER — Encounter: Payer: Self-pay | Admitting: Emergency Medicine

## 2024-03-20 ENCOUNTER — Ambulatory Visit: Admission: EM | Admit: 2024-03-20 | Discharge: 2024-03-20 | Disposition: A | Discharge status: Home / Self Care

## 2024-03-20 DIAGNOSIS — J45901 Unspecified asthma with (acute) exacerbation: Secondary | ICD-10-CM | POA: Diagnosis not present

## 2024-03-20 DIAGNOSIS — L309 Dermatitis, unspecified: Secondary | ICD-10-CM | POA: Diagnosis not present

## 2024-03-20 MED ORDER — IPRATROPIUM-ALBUTEROL 0.5-2.5 (3) MG/3ML IN SOLN: 3.0000 mL | RESPIRATORY_TRACT | 0 refills | Status: DC | PRN

## 2024-03-20 MED ORDER — PREDNISONE 10 MG (21) PO TBPK: ORAL_TABLET | ORAL | 0 refills | Status: DC

## 2024-03-20 MED ORDER — CLOBETASOL PROPIONATE 0.05 % EX OINT: 1.0000 | TOPICAL_OINTMENT | Freq: Two times a day (BID) | CUTANEOUS | 0 refills | Status: DC

## 2024-03-20 MED ORDER — BENZONATATE 100 MG PO CAPS: 100.0000 mg | ORAL_CAPSULE | Freq: Three times a day (TID) | ORAL | 0 refills | Status: DC

## 2024-03-20 MED ORDER — GUAIFENESIN ER 600 MG PO TB12: 600.0000 mg | ORAL_TABLET | Freq: Two times a day (BID) | ORAL | 0 refills | Status: AC

## 2024-03-20 NOTE — ED Provider Notes (Signed)
 " [facility]    CSN: [id number] Arrival date & time: 03/20/24  1830      History   Chief Complaint Chief Complaint  Patient presents with   Breathing Problem   Cough   Skin Problem    HPI Stacy Mcgee is a 28 y.o. female.   Patient with history of asthma presents today due to 1 week of persistent cough productive of yellow sputum.  Patient states that she is not experiencing significant relief with use of Mucinex , cough drops, and albuterol  inhaler.  Denies patient denies fever, chills, nausea, or vomiting.   Patient patient states that she is also having a very bad eczema flare.  Patient states is the worst flare that she has had in her life.  Patient states that her skin is cracking and breaking.  Patient states she is using OTC hydrocortisone cream without significant relief.  The history is provided by the patient.  Breathing Problem  Cough   Past Medical History:  Diagnosis Date   Adult body mass index 36.0-36.9 10/17/2012   Allergic rhinitis 10/24/2012   STORY: hx of med in past.`E1o3L`IMPRESSION: rx to pharmacy, to call if worsening     Asthma    Asthma 10/24/2012   STORY: seen friday in ER. [place]. `E1o3L`steroid pack, given spacer for her ventolin . `E1o3L`feeling much better overall, has tight cough.`E1o3L`IMPRESSION: spirometry today shows mild obstruction. `E1o3L`cont qvar bid, use ventolin  prn qid. call prn`E1o3L`discussed avoiding allergens, no smoking in house. con't claritin. f/u for CPE     Atopic dermatitis and related condition 10/24/2012   IMPRESSION: Severe.  Has no dermatologist, and has been using just Eucerin cream which has proven ineffective.  Rx of Triamcinolone  0.5% to use with Eucerin BID for one month and follow up at that time.  Add Zyrtec daily for allergic component.     Eczema    Generalized headaches    Irregular menstrual cycle 10/24/2012   Migraine without aura and without status migrainosus, not intractable  03/04/2015   Obesity 10/24/2012   IMPRESSION: discussed portions, food choices, avoid sugar drinks, increase exercise     Problems influencing health status 10/24/2012   Screening examination for STI 10/07/2022   Seasonal allergies    Sleeping difficulty 03/04/2015   Tension headache 03/04/2015    Patient Active Problem List   Diagnosis Date Noted   Obesity affecting pregnancy, antepartum 11/13/2023   Prediabetes 07/22/2023    Past Surgical History:  Procedure Laterality Date   HAND SURGERY  02/2012    OB History     Gravida  1   Para  1   Term      Preterm  1   AB      Living  1      SAB      IAB      Ectopic      Multiple  0   Live Births  1            Home Medications    Prior to Admission medications  Medication Sig Start Date End Date Taking? Authorizing Provider  albuterol  (VENTOLIN  HFA) 108 (90 Base) MCG/ACT inhaler Inhale into the lungs every 6 (six) hours as needed for wheezing or shortness of breath.   Yes [provider]  benzonatate  (TESSALON ) 100 MG capsule Take 1 capsule (100 mg total) by mouth every 8 (eight) hours. 03/20/24  Yes Andra Krabbe C, PA-C  clobetasol  ointment (TEMOVATE ) 0.05 % Apply 1 Application topically 2 (  two) times daily. 03/20/24  Yes Andra Corean BROCKS, PA-C  guaiFENesin  (MUCINEX ) 600 MG 12 hr tablet Take 1 tablet (600 mg total) by mouth 2 (two) times daily for 10 days. 03/20/24 03/30/24 Yes Andra Corean BROCKS, PA-C  predniSONE  (STERAPRED UNI-PAK 21 TAB) 10 MG (21) TBPK tablet Take as directed on back of package 03/20/24  Yes Andra Corean BROCKS, PA-C  fluconazole  (DIFLUCAN ) 150 MG tablet Take 150 mg by mouth once. Patient not taking: Reported on 03/20/2024 10/09/23   [provider]  ipratropium-albuterol  (DUONEB) 0.5-2.5 (3) MG/3ML SOLN Take 3 mLs by nebulization every 4 (four) hours as needed. 03/20/24   Andra Corean BROCKS, PA-C    Family History Family History  Problem Relation  Age of Onset   Bipolar disorder Mother    Depression Mother    Migraines Father    ADD / ADHD Sister    Migraines Maternal Aunt    Cancer Maternal Grandfather    Heart Problems Paternal Grandfather     Social History Social History[1]   Allergies   Peanut-containing drug products   Review of Systems Review of Systems  Respiratory:  Positive for cough.      Physical Exam Triage Vital Signs ED Triage Vitals  Encounter Vitals Group     BP 03/20/24 1856 (!) 142/81     Girls Systolic BP Percentile --      Girls Diastolic BP Percentile --      Boys Systolic BP Percentile --      Boys Diastolic BP Percentile --      Pulse Rate 03/20/24 1856 (!) 112     Resp 03/20/24 1856 18     Temp 03/20/24 1856 (!) 97.3 F (36.3 C)     Temp Source 03/20/24 1856 Oral     SpO2 03/20/24 1856 99 %     Weight --      Height --      Head Circumference --      Peak Flow --      Pain Score 03/20/24 1857 10     Pain Loc --      Pain Education --      Exclude from Growth Chart --    No data found.  Updated Vital Signs BP (!) 142/81 (BP Location: Left Arm)   Pulse (!) 112   Temp (!) 97.3 F (36.3 C) (Oral)   Resp 18   LMP 01/15/2024 (Approximate)   SpO2 99%   Breastfeeding No   Visual Acuity Right Eye Distance:   Left Eye Distance:   Bilateral Distance:    Right Eye Near:   Left Eye Near:    Bilateral Near:     Physical Exam Vitals and nursing note reviewed.  Constitutional:      General: She is not in acute distress.    Appearance: Normal appearance. She is not ill-appearing, toxic-appearing or diaphoretic.  HENT:     Nose: Congestion (moderately enlarged turbinates) present. No rhinorrhea.     Mouth/Throat:     Mouth: Mucous membranes are moist.     Pharynx: Oropharynx is clear. No oropharyngeal exudate or posterior oropharyngeal erythema.  Eyes:     General: No scleral icterus. Cardiovascular:     Rate and Rhythm: Normal rate and regular rhythm.     Heart sounds:  Normal heart sounds.  Pulmonary:     Effort: Pulmonary effort is normal. No respiratory distress.     Breath sounds: Normal breath sounds. No wheezing or rhonchi.  Skin:  General: Skin is warm.     Comments: Hyperpigmented, scaly skin, cracking with open wounds noted  Neurological:     Mental Status: She is alert and oriented to person, place, and time.  Psychiatric:        Mood and Affect: Mood normal.        Behavior: Behavior normal.      UC Treatments / Results  Labs (all labs ordered are listed, but only abnormal results are displayed) Labs Reviewed - No data to display  EKG   Radiology No results found.  Procedures Procedures (including critical care time)  Medications Ordered in UC Medications - No data to display  Initial Impression / Assessment and Plan / UC Course  I have reviewed the triage vital signs and the nursing notes.  Pertinent labs & imaging results that were available during my care of the patient were reviewed by me and considered in my medical decision making (see chart for details).     Final Clinical Impressions(s) / UC Diagnoses   Final diagnoses:  Asthma with acute exacerbation, unspecified asthma severity, unspecified whether persistent  Eczema, unspecified type   Discharge Instructions   None    ED Prescriptions     Medication Sig Dispense Auth. Provider   ipratropium-albuterol  (DUONEB) 0.5-2.5 (3) MG/3ML SOLN  (Status: Discontinued) Take 3 mLs by nebulization every 4 (four) hours as needed. 75 mL Andra Krabbe C, PA-C   ipratropium-albuterol  (DUONEB) 0.5-2.5 (3) MG/3ML SOLN Take 3 mLs by nebulization every 4 (four) hours as needed. 75 mL Andra Krabbe C, PA-C   predniSONE  (STERAPRED UNI-PAK 21 TAB) 10 MG (21) TBPK tablet Take as directed on back of package 21 tablet Andra Krabbe BROCKS, PA-C   benzonatate  (TESSALON ) 100 MG capsule Take 1 capsule (100 mg total) by mouth every 8 (eight) hours. 30 capsule Andra Krabbe C, PA-C   guaiFENesin  (MUCINEX ) 600 MG 12 hr tablet Take 1 tablet (600 mg total) by mouth 2 (two) times daily for 10 days. 20 tablet Briahnna Harries C, PA-C   clobetasol  ointment (TEMOVATE ) 0.05 % Apply 1 Application topically 2 (two) times daily. 30 g Andra Krabbe BROCKS, PA-C      PDMP not reviewed this encounter.    [1]  Social History Tobacco Use   Smoking status: Some Days    Current packs/day: 0.00    Types: Cigarettes    Last attempt to quit: 05/11/2023    Years since quitting: 0.8   Smokeless tobacco: Never   Tobacco comments:    4 cigarettes per day  Vaping Use   Vaping status: Never Used  Substance Use Topics   Alcohol use: Not Currently    Comment: occ   Drug use: Not Currently    Types: Marijuana    Comment: last use may 2025     Andra Krabbe BROCKS, PA-C 03/20/24 1932  "

## 2024-03-20 NOTE — ED Triage Notes (Addendum)
 Pt reports productive cough, chest congestion, and intermittent SOB x1 week. Pt reports difficulty breathing has gotten significantly worse the last few days as she has run out of albuterol  inhaler. Taking mucus relief otc with little relief. Hx of asthma. Pt is also have severe eczema flare all over her body x1 week. Reports these flares have been ongoing since birth of her child. Skin is so dry it is peeling and bleeding all over arms, legs, back, and stomach. Using hydrocortisone cream and vaseline with no relief.

## 2024-03-31 ENCOUNTER — Ambulatory Visit: Payer: Self-pay

## 2024-03-31 NOTE — Telephone Encounter (Signed)
 FYI Only or Action Required?: FYI only for provider: appointment scheduled on 04/02/24.  Patient was last seen in primary care on 01/28/2024 by Kennyth Domino, FNP.  Called Nurse Triage reporting Rash.  Symptoms began several days ago.  Interventions attempted: OTC medications: claritin and Prescription medications: clobetasol  ointment.  Symptoms are: gradually worsening.  Triage Disposition: See Physician Within 24 Hours  Patient/caregiver understands and will follow disposition?: Yes     Copied from CRM #[id number]. Topic: Clinical - Red Word Triage >> Mar 31, 2024  3:41 PM Sophia H wrote: Red Word that prompted transfer to Nurse Triage: Severe pain from eczema flare up - states she can feel her skin breaking. Was seen in UC back on 12/26 and given steroids that helped only for a few days.  Patient called into [facility] looking to est care.   Reason for Disposition  ELINA.ECK ] Looks infected (e.g., spreading redness, pus) AND [2] no fever  Answer Assessment - Initial Assessment Questions Pt called to get established at [facility] for eczema flare. Pt states she has a hx of eczema and was seen at [facility] 12/26, given clobetasol  ointment and prednisone  taper for asthma. Pt states that once she completed the pred taper, eczema flare worsened and caused a massive skin shed resulting in her skin breaking and now she has wounds from broken skin. Pt denies fever but states that some wounds do appear infected. Pt has newborn but is not breast feeding or pumping; no concern for medications being passed to milk. Pt is taking a daily claritin, applying clobetasol  with eucerin lotion and vaseline. Discussed continuing Claritin daily, mixing clobetasol  ointment with cerave moisturizing cream d/t water based and no alcohol and applying to body throughout. Discussed limiting exposure to hot water in shower, trying luke warm and pat drying, applying moisturizer with steroid ointment once she towel dries  for absorption. Pt scheduled for 04/02/24 and placed on wait list for sooner appt. Pt open to any other home care advise until appt. Available via my chart or telephone.      1. MAIN SYMPTOM: What is your main concern or symptom? (e.g., dry, flaky, bumpy or rough, cracked, itchy)     Dry, cracked skin   2. LOCATION: Where is the cracked or dry skin located?       Body throughout   3. ONSET: When did the cracked or dry skin begin?     After finishing pred taper from [facility] 12/26  4. CAUSE: What do you think is causing the cracked or dry skin?     Pt reports hx of eczema and suspects she is having a severe eczema flare   5. PAIN: Is there any pain? If Yes, ask: How bad is the pain?  (e.g., Scale 1-10; mild, moderate, or severe)     Yes; pt states her skin is broken and cracked, pt does have open wounds   6. INFECTION: Does the skin look red or infected?     Yes, pt states that skin is broken, red and some area have pus present   7. OTHER SYMPTOMS: Do you have any other symptoms? (e.g.,  fever, itching, rash or redness)     No fever, painful   8. PREGNANCY: Is there any chance you are pregnant? When was your last menstrual period?     Pt has newborn  Protocols used: Cracked or Dry Skin-A-AH

## 2024-03-31 NOTE — Progress Notes (Signed)
 Erroneous encounter

## 2024-03-31 NOTE — Telephone Encounter (Signed)
 Patient scheduled for an appointment with Dr. Sebastian for 04/02/24.

## 2024-04-02 ENCOUNTER — Ambulatory Visit (INDEPENDENT_AMBULATORY_CARE_PROVIDER_SITE_OTHER): Admitting: Family Medicine

## 2024-04-02 ENCOUNTER — Encounter: Payer: Self-pay | Admitting: Family Medicine

## 2024-04-02 ENCOUNTER — Ambulatory Visit: Payer: Self-pay | Admitting: Family Medicine

## 2024-04-02 VITALS — BP 117/71 | HR 119 | Temp 97.7°F | Ht 63.0 in | Wt 218.6 lb

## 2024-04-02 DIAGNOSIS — D72825 Bandemia: Secondary | ICD-10-CM

## 2024-04-02 DIAGNOSIS — E876 Hypokalemia: Secondary | ICD-10-CM

## 2024-04-02 DIAGNOSIS — D75839 Thrombocytosis, unspecified: Secondary | ICD-10-CM

## 2024-04-02 DIAGNOSIS — J4541 Moderate persistent asthma with (acute) exacerbation: Secondary | ICD-10-CM | POA: Diagnosis not present

## 2024-04-02 DIAGNOSIS — L03113 Cellulitis of right upper limb: Secondary | ICD-10-CM | POA: Insufficient documentation

## 2024-04-02 DIAGNOSIS — D649 Anemia, unspecified: Secondary | ICD-10-CM

## 2024-04-02 DIAGNOSIS — L2084 Intrinsic (allergic) eczema: Secondary | ICD-10-CM

## 2024-04-02 LAB — CBC WITH DIFFERENTIAL/PLATELET
Basophils Absolute: 0 K/uL (ref 0.0–0.1)
Basophils Relative: 0.3 % (ref 0.0–3.0)
Eosinophils Absolute: 1.8 K/uL — ABNORMAL HIGH (ref 0.0–0.7)
Eosinophils Relative: 13.7 % — ABNORMAL HIGH (ref 0.0–5.0)
HCT: 34.9 % — ABNORMAL LOW (ref 36.0–46.0)
Hemoglobin: 11 g/dL — ABNORMAL LOW (ref 12.0–15.0)
Lymphocytes Relative: 10.1 % — ABNORMAL LOW (ref 12.0–46.0)
Lymphs Abs: 1.3 K/uL (ref 0.7–4.0)
MCHC: 31.6 g/dL (ref 30.0–36.0)
MCV: 78.3 fl (ref 78.0–100.0)
Monocytes Absolute: 0.7 K/uL (ref 0.1–1.0)
Monocytes Relative: 5.1 % (ref 3.0–12.0)
Neutro Abs: 9.1 K/uL — ABNORMAL HIGH (ref 1.4–7.7)
Neutrophils Relative %: 70.8 % (ref 43.0–77.0)
Platelets: 403 K/uL — ABNORMAL HIGH (ref 150.0–400.0)
RBC: 4.46 Mil/uL (ref 3.87–5.11)
RDW: 18.6 % — ABNORMAL HIGH (ref 11.5–15.5)
WBC: 12.9 K/uL — ABNORMAL HIGH (ref 4.0–10.5)

## 2024-04-02 LAB — COMPREHENSIVE METABOLIC PANEL WITH GFR
ALT: 11 U/L (ref 3–35)
AST: 14 U/L (ref 5–37)
Albumin: 3.8 g/dL (ref 3.5–5.2)
Alkaline Phosphatase: 68 U/L (ref 39–117)
BUN: 5 mg/dL — ABNORMAL LOW (ref 6–23)
CO2: 25 meq/L (ref 19–32)
Calcium: 8.6 mg/dL (ref 8.4–10.5)
Chloride: 107 meq/L (ref 96–112)
Creatinine, Ser: 0.51 mg/dL (ref 0.40–1.20)
GFR: 128.8 mL/min
Glucose, Bld: 97 mg/dL (ref 70–99)
Potassium: 3.4 meq/L — ABNORMAL LOW (ref 3.5–5.1)
Sodium: 139 meq/L (ref 135–145)
Total Bilirubin: 0.3 mg/dL (ref 0.2–1.2)
Total Protein: 6.7 g/dL (ref 6.0–8.3)

## 2024-04-02 LAB — C-REACTIVE PROTEIN: CRP: 2.3 mg/dL (ref 1.0–20.0)

## 2024-04-02 LAB — SEDIMENTATION RATE: Sed Rate: 13 mm/h (ref 0–20)

## 2024-04-02 MED ORDER — HYDROXYZINE HCL 50 MG PO TABS: 50.0000 mg | ORAL_TABLET | Freq: Three times a day (TID) | ORAL | 0 refills | Status: AC | PRN

## 2024-04-02 MED ORDER — HYDROXYZINE HCL 50 MG PO TABS: 50.0000 mg | ORAL_TABLET | Freq: Three times a day (TID) | ORAL | 0 refills | Status: DC | PRN

## 2024-04-02 MED ORDER — METHYLPREDNISOLONE SODIUM SUCC 125 MG IJ SOLR
125.0000 mg | Freq: Once | INTRAMUSCULAR | Status: AC
  Administered 2024-04-02: 125 mg via INTRAMUSCULAR

## 2024-04-02 MED ORDER — LEVOCETIRIZINE DIHYDROCHLORIDE 5 MG PO TABS: 5.0000 mg | ORAL_TABLET | Freq: Every evening | ORAL | 3 refills | Status: AC

## 2024-04-02 MED ORDER — CLOBETASOL PROPIONATE 0.05 % EX OINT: 1.0000 | TOPICAL_OINTMENT | Freq: Two times a day (BID) | CUTANEOUS | 3 refills | Status: AC

## 2024-04-02 MED ORDER — CEPHALEXIN 500 MG PO CAPS: 500.0000 mg | ORAL_CAPSULE | Freq: Two times a day (BID) | ORAL | 0 refills | Status: AC

## 2024-04-02 MED ORDER — POTASSIUM CHLORIDE CRYS ER 20 MEQ PO TBCR: 20.0000 meq | EXTENDED_RELEASE_TABLET | Freq: Every day | ORAL | 3 refills | Status: AC

## 2024-04-02 MED ORDER — TRAMADOL HCL 50 MG PO TABS: 50.0000 mg | ORAL_TABLET | Freq: Four times a day (QID) | ORAL | 0 refills | Status: AC | PRN

## 2024-04-02 MED ORDER — PREDNISONE 10 MG PO TABS: ORAL_TABLET | ORAL | 0 refills | Status: AC

## 2024-04-02 MED ORDER — IPRATROPIUM-ALBUTEROL 0.5-2.5 (3) MG/3ML IN SOLN
3.0000 mL | Freq: Once | RESPIRATORY_TRACT | Status: AC
  Administered 2024-04-02: 3 mL via RESPIRATORY_TRACT

## 2024-04-02 MED ORDER — IPRATROPIUM-ALBUTEROL 0.5-2.5 (3) MG/3ML IN SOLN: 3.0000 mL | RESPIRATORY_TRACT | 0 refills | Status: AC | PRN

## 2024-04-02 MED ORDER — BUDESONIDE-FORMOTEROL FUMARATE 80-4.5 MCG/ACT IN AERO: 2.0000 | INHALATION_SPRAY | Freq: Two times a day (BID) | RESPIRATORY_TRACT | 3 refills | Status: AC

## 2024-04-02 NOTE — Progress Notes (Signed)
 " Assessment & Plan   Assessment/Plan:     Assessment & Plan Intrinsic atopic dermatitis with secondary cellulitis of right upper extremity Severe atopic dermatitis flare since September 17th, postpartum, with extensive involvement of neck, arms, chest, stomach, and back. Secondary cellulitis on right arm with multiple excoriations and scaling. Painful and pruritic, affecting daily activities. Previous prednisone  treatment provided temporary relief. No current topical treatment. Potential infection requires antibiotics. Injectable medications discussed but deferred to dermatology. - Prescribed cephalexin  500 mg BID for 7 days for cellulitis. - Administered methylprednisolone  125 mg in office. - Initiated prednisone  taper starting at 60 mg, decreasing by 10 mg daily until completion. - Prescribed clobetasol  propionate 0.05% ointment for wet wrap application. - Prescribed levocetirizine 5 mg every evening, may increase to 20 mg daily. - Prescribed hydroxyzine  50 mg TID as needed for pruritus. - Prescribed tramadol  50 mg every 6 hours as needed for pain and pruritus. - Referred urgently to dermatology. - Provided ED precautions for fevers, pus, or worsening drainage. - CBC, CMP, ESR, CRP  Moderate persistent asthma with exacerbation Asthma exacerbation since December, with wheezing and previous asthma attack. Current wheezing on examination. Requires daily inhaler and steroid treatment for control. - Prescribed Symbicort  inhaler, 2 puffs BID for asthma control. - Prescribed DuoNeb solution for use every 4-6 hours as needed. - Administered methylprednisolone  125 mg IM 6 in office. - Initiated prednisone  taper starting at 60 mg, decreasing by 10 mg daily until completion.        Medications Discontinued During This Encounter  Medication Reason   ipratropium-albuterol  (DUONEB) 0.5-2.5 (3) MG/3ML SOLN Reorder   clobetasol  ointment (TEMOVATE ) 0.05 % Reorder    Return in about 1 week  (around 04/09/2024).        Subjective:   Encounter date: 04/02/2024  Stacy Mcgee is a 29 y.o. female who has Prediabetes and Obesity affecting pregnancy, antepartum on their problem list..   She  has a past medical history of Adult body mass index 36.0-36.9 (10/17/2012), Allergic rhinitis (10/24/2012), Asthma, Asthma (10/24/2012), Atopic dermatitis and related condition (10/24/2012), Eczema, Generalized headaches, Irregular menstrual cycle (10/24/2012), Migraine without aura and without status migrainosus, not intractable (03/04/2015), Obesity (10/24/2012), Problems influencing health status (10/24/2012), Screening examination for STI (10/07/2022), Seasonal allergies, Sleeping difficulty (03/04/2015), and Tension headache (03/04/2015)..   She presents with chief complaint of Establish Care (Pt presents today to establish care. Pt doesn't have any questions or concerns. Pt not fasting ) .   Discussed the use of AI scribe software for clinical note transcription with the patient, who gave verbal consent to proceed.  History of Present Illness Stacy Mcgee is a 29 year old female with asthma and atopic dermatitis who presents with a severe flare of atopic dermatitis.  Atopic dermatitis flare - Severe flare began approximately one week after childbirth on September 17th - Progressive worsening of symptoms despite initial improvement with prednisone  prescribed at urgent care - Severe pain and pruritus affecting neck, arms, chest, stomach, and back - Open sores present, arms particularly affected - Painful lesions make it difficult to sit, move, and care for newborn - Significant discomfort persists despite use of Claritin for itching - No recent use of topical treatments - Distress due to impact on ability to care for newborn  Asthma symptoms - History of asthma with recent exacerbation around Christmas - Wheezing present since December asthma attack - Currently out of  albuterol  inhaler - Ongoing management of symptoms since December  Postpartum status -  Gave birth on September 17th - Currently caring for a newborn     ROS  Past Surgical History:  Procedure Laterality Date   HAND SURGERY  02/2012    Outpatient Medications Prior to Visit  Medication Sig Dispense Refill   albuterol  (VENTOLIN  HFA) 108 (90 Base) MCG/ACT inhaler Inhale into the lungs every 6 (six) hours as needed for wheezing or shortness of breath.     ipratropium-albuterol  (DUONEB) 0.5-2.5 (3) MG/3ML SOLN Take 3 mLs by nebulization every 4 (four) hours as needed. 75 mL 0   benzonatate  (TESSALON ) 100 MG capsule Take 1 capsule (100 mg total) by mouth every 8 (eight) hours. (Patient not taking: Reported on 04/02/2024) 30 capsule 0   fluconazole  (DIFLUCAN ) 150 MG tablet Take 150 mg by mouth once. (Patient not taking: Reported on 04/02/2024)     predniSONE  (STERAPRED UNI-PAK 21 TAB) 10 MG (21) TBPK tablet Take as directed on back of package (Patient not taking: Reported on 04/02/2024) 21 tablet 0   clobetasol  ointment (TEMOVATE ) 0.05 % Apply 1 Application topically 2 (two) times daily. (Patient not taking: Reported on 04/02/2024) 30 g 0   No facility-administered medications prior to visit.    Family History  Problem Relation Age of Onset   Bipolar disorder Mother    Depression Mother    Migraines Father    ADD / ADHD Sister    Migraines Maternal Aunt    Cancer Maternal Grandfather    Heart Problems Paternal Grandfather     Social History   Socioeconomic History   Marital status: Significant Other    Spouse name: Not on file   Number of children: Not on file   Years of education: Not on file   Highest education level: Not on file  Occupational History   Not on file  Tobacco Use   Smoking status: Some Days    Current packs/day: 0.00    Types: Cigarettes    Last attempt to quit: 05/11/2023    Years since quitting: 0.8   Smokeless tobacco: Never   Tobacco comments:    4  cigarettes per day  Vaping Use   Vaping status: Never Used  Substance and Sexual Activity   Alcohol use: Not Currently    Comment: occ   Drug use: Not Currently    Types: Marijuana    Comment: last use may 2025   Sexual activity: Yes    Birth control/protection: None  Other Topics Concern   Not on file  Social History Narrative   Antoninette is in twelfth grade at [employer]. She is doing well.    Lives with her mother, younger brother, and younger sister. She has a 61 year old sister that does not live in the home.   Social Drivers of Health   Tobacco Use: High Risk (04/02/2024)   Patient History    Smoking Tobacco Use: Some Days    Smokeless Tobacco Use: Never    Passive Exposure: Not on file  Financial Resource Strain: Not on file  Food Insecurity: Food Insecurity Present (12/10/2023)   Epic    Worried About Programme Researcher, Broadcasting/film/video in the Last Year: Sometimes true    [employer] of Food in the Last Year: Sometimes true  Transportation Needs: No Transportation Needs (12/10/2023)   Epic    Lack of Transportation (Medical): No    Lack of Transportation (Non-Medical): No  Physical Activity: Not on file  Stress: Not on file  Social Connections: Not on file  Intimate Partner Violence: Not At Risk (12/10/2023)   Epic    Fear of Current or Ex-Partner: No    Emotionally Abused: No    Physically Abused: No    Sexually Abused: No  Depression (PHQ2-9): Medium Risk (04/02/2024)   Depression (PHQ2-9)    PHQ-2 Score: 5  Alcohol Screen: Not on file  Housing: Low Risk (12/10/2023)   Epic    Unable to Pay for Housing in the Last Year: No    Number of Times Moved in the Last Year: 0    Homeless in the Last Year: No  Utilities: At Risk (12/10/2023)   Epic    Threatened with loss of utilities: Yes  Health Literacy: Not on file                                                                                                  Objective:  Physical Exam: BP 117/71   Pulse (!) 119    Temp 97.7 F (36.5 C)   Ht 5' 3 (1.6 m)   Wt 218 lb 9.6 oz (99.2 kg)   LMP 02/14/2024 (Within Days)   SpO2 100%   Breastfeeding No   BMI 38.72 kg/m   Chaperone: Tinnie Marten Physical Exam GENERAL: Alert, cooperative, well developed, no acute distress. HEENT: Normocephalic, normal oropharynx, moist mucous membranes. CHEST: Wheezing on auscultation. CARDIOVASCULAR: Normal heart rate and rhythm, S1 and S2 normal without murmurs. ABDOMEN: Soft, non-tender, non-distended, without organomegaly, normal bowel sounds. EXTREMITIES: No cyanosis or edema. NEUROLOGICAL: Cranial nerves grossly intact, moves all extremities without gross motor or sensory deficit. Psych: Tearful SKIN: Cellulitis on right arm, multiple excoriations throughout body, scaling on left face, complete trunk, bilateral extremities.              Physical Exam  No results found.  Recent Results (from the past 2160 hours)  Sedimentation rate     Status: None   Collection Time: 04/02/24 11:28 AM  Result Value Ref Range   Sed Rate 13 0 - 20 mm/hr  C-reactive protein     Status: None   Collection Time: 04/02/24 11:28 AM  Result Value Ref Range   CRP 2.3 1.0 - 20.0 mg/dL  CBC w/Diff     Status: Abnormal   Collection Time: 04/02/24 11:28 AM  Result Value Ref Range   WBC 12.9 (H) 4.0 - 10.5 K/uL   RBC 4.46 3.87 - 5.11 Mil/uL   Hemoglobin 11.0 (L) 12.0 - 15.0 g/dL   HCT 65.0 (L) 63.9 - 53.9 %   MCV 78.3 78.0 - 100.0 fl   MCHC 31.6 30.0 - 36.0 g/dL   RDW 81.3 (H) 88.4 - 84.4 %   Platelets 403.0 (H) 150.0 - 400.0 K/uL   Neutrophils Relative % 70.8 43.0 - 77.0 %   Lymphocytes Relative 10.1 (L) 12.0 - 46.0 %   Monocytes Relative 5.1 3.0 - 12.0 %   Eosinophils Relative 13.7 (H) 0.0 - 5.0 %   Basophils Relative 0.3 0.0 - 3.0 %   Neutro Abs 9.1 (H) 1.4 - 7.7 K/uL   Lymphs Abs 1.3 0.7 -  4.0 K/uL   Monocytes Absolute 0.7 0.1 - 1.0 K/uL   Eosinophils Absolute 1.8 (H) 0.0 - 0.7 K/uL   Basophils Absolute 0.0  0.0 - 0.1 K/uL  Comp Met (CMET)     Status: Abnormal   Collection Time: 04/02/24 11:28 AM  Result Value Ref Range   Sodium 139 135 - 145 mEq/L   Potassium 3.4 (L) 3.5 - 5.1 mEq/L   Chloride 107 96 - 112 mEq/L   CO2 25 19 - 32 mEq/L    Comment: Elevated LDH levels may cause falsely increased CO2 results. If LDH is >2000 U/L, a positive bias of 12% is possible.   Glucose, Bld 97 70 - 99 mg/dL   BUN 5 (L) 6 - 23 mg/dL   Creatinine, Ser 9.48 0.40 - 1.20 mg/dL   Total Bilirubin 0.3 0.2 - 1.2 mg/dL   Alkaline Phosphatase 68 39 - 117 U/L   AST 14 5 - 37 U/L   ALT 11 3 - 35 U/L   Total Protein 6.7 6.0 - 8.3 g/dL   Albumin 3.8 3.5 - 5.2 g/dL   GFR 871.19 >39.99 mL/min    Comment: Calculated using the CKD-EPI Creatinine Equation (2021)   Calcium  8.6 8.4 - 10.5 mg/dL        Beverley Adine Hummer, MD, MS  "

## 2024-04-02 NOTE — Patient Instructions (Addendum)
 It was very nice to see you today!  VISIT SUMMARY: Today, we addressed your severe atopic dermatitis flare and asthma exacerbation. We provided medications to manage your skin condition and asthma symptoms, and referred you to a dermatologist for further care.  YOUR PLAN: SEVERE ATOPIC DERMATITIS WITH SECONDARY CELLULITIS: You have a severe flare of atopic dermatitis with a secondary infection on your right arm. This is causing significant pain and itching, affecting your daily activities. -Take cephalexin  500 mg twice a day for 7 days to treat the infection. -You received a methylprednisolone  injection in the office. -Start a prednisone  taper beginning at 60 mg, decreasing by 10 mg daily until finished. -Apply clobetasol  propionate 0.05% ointment as part of an [place] boot wrap. -Take levocetirizine 5 mg every evening, and you may increase to 20 mg daily if needed. -Take hydroxyzine  50 mg three times a day as needed for itching. -Take tramadol  50 mg every 6 hours as needed for pain and itching. -You have been referred urgently to a dermatologist. -Go to the emergency department if you develop a fever, pus, or worsening drainage from your sores.  MODERATE PERSISTENT ASTHMA WITH EXACERBATION: Your asthma has worsened since December, and you are experiencing wheezing. -Use the Symbicort  inhaler, 2 puffs twice a day for asthma control. -Use the DuoNeb solution every 4-6 hours as needed. -You received a methylprednisolone  injection in the office. -Start a prednisone  taper beginning at 60 mg, decreasing by 10 mg daily until finished.  Return in about 1 week (around 04/09/2024).   Take care, Arvella Hummer, MD, MS   PLEASE NOTE:  If you had any lab tests, please let us  know if you have not heard back within a few days. You may see your results on mychart before we have a chance to review them but we will give you a call once they are reviewed by us .   If we ordered any referrals today, please  let us  know if you have not heard from their office within the next week.   If you had any urgent prescriptions sent in today, please check with the pharmacy within an hour of our visit to make sure the prescription was transmitted appropriately.   Please try these tips to maintain a healthy lifestyle:  Eat at least 3 REAL meals and 1-2 snacks per day.  Aim for no more than 5 hours between eating.  If you eat breakfast, please do so within one hour of getting up.   Each meal should contain half fruits/vegetables, one quarter protein, and one quarter carbs (no bigger than a computer mouse)  Cut down on sweet beverages. This includes juice, soda, and sweet tea.   Drink at least 1 glass of water with each meal and aim for at least 8 glasses per day  Exercise at least 150 minutes every week.

## 2024-04-03 ENCOUNTER — Encounter: Payer: Self-pay | Admitting: Advanced Practice Midwife

## 2024-04-03 LAB — ANA,IFA RA DIAG PNL W/RFLX TIT/PATN
Anti Nuclear Antibody (ANA): NEGATIVE
Cyclic Citrullin Peptide Ab: <16 U
Rheumatoid fact SerPl-aCnc: 11 [IU]/mL

## 2024-04-04 ENCOUNTER — Encounter: Payer: Self-pay | Admitting: Family Medicine

## 2024-04-07 NOTE — Telephone Encounter (Signed)
 Recommend stopping tramadol . May use OTC acetaminophen  for pain. Other medications are safe to use. She should follow up with her OB/GYN for further pregancy management.

## 2024-04-08 ENCOUNTER — Inpatient Hospital Stay (HOSPITAL_COMMUNITY)
Admission: AD | Admit: 2024-04-08 | Discharge: 2024-04-08 | Disposition: A | Discharge status: Home / Self Care | Attending: Obstetrics and Gynecology | Admitting: Obstetrics and Gynecology

## 2024-04-08 ENCOUNTER — Encounter (HOSPITAL_COMMUNITY): Payer: Self-pay

## 2024-04-08 ENCOUNTER — Inpatient Hospital Stay (HOSPITAL_COMMUNITY)

## 2024-04-08 DIAGNOSIS — O26891 Other specified pregnancy related conditions, first trimester: Secondary | ICD-10-CM | POA: Insufficient documentation

## 2024-04-08 DIAGNOSIS — O3680X0 Pregnancy with inconclusive fetal viability, not applicable or unspecified: Secondary | ICD-10-CM | POA: Diagnosis not present

## 2024-04-08 DIAGNOSIS — R109 Unspecified abdominal pain: Secondary | ICD-10-CM | POA: Diagnosis not present

## 2024-04-08 DIAGNOSIS — Z3A01 Less than 8 weeks gestation of pregnancy: Secondary | ICD-10-CM | POA: Insufficient documentation

## 2024-04-08 DIAGNOSIS — O209 Hemorrhage in early pregnancy, unspecified: Secondary | ICD-10-CM | POA: Diagnosis present

## 2024-04-08 DIAGNOSIS — N939 Abnormal uterine and vaginal bleeding, unspecified: Secondary | ICD-10-CM | POA: Diagnosis not present

## 2024-04-08 LAB — COMPREHENSIVE METABOLIC PANEL WITH GFR
ALT: 14 U/L (ref 0–44)
AST: 18 U/L (ref 15–41)
Albumin: 4 g/dL (ref 3.5–5.0)
Alkaline Phosphatase: 86 U/L (ref 38–126)
Anion gap: 13 (ref 5–15)
BUN: 5 mg/dL — ABNORMAL LOW (ref 6–20)
CO2: 22 mmol/L (ref 22–32)
Calcium: 8.9 mg/dL (ref 8.9–10.3)
Chloride: 104 mmol/L (ref 98–111)
Creatinine, Ser: 0.63 mg/dL (ref 0.44–1.00)
GFR, Estimated: >60 mL/min
Glucose, Bld: 80 mg/dL (ref 70–99)
Potassium: 3.7 mmol/L (ref 3.5–5.1)
Sodium: 139 mmol/L (ref 135–145)
Total Bilirubin: 0.3 mg/dL (ref 0.0–1.2)
Total Protein: 7.1 g/dL (ref 6.5–8.1)

## 2024-04-08 LAB — POCT PREGNANCY, URINE: Preg Test, Ur: POSITIVE — AB

## 2024-04-08 LAB — CBC
HCT: 37.5 % (ref 36.0–46.0)
Hemoglobin: 11.8 g/dL — ABNORMAL LOW (ref 12.0–15.0)
MCH: 24.9 pg — ABNORMAL LOW (ref 26.0–34.0)
MCHC: 31.5 g/dL (ref 30.0–36.0)
MCV: 79.3 fL — ABNORMAL LOW (ref 80.0–100.0)
Platelets: 454 K/uL — ABNORMAL HIGH (ref 150–400)
RBC: 4.73 MIL/uL (ref 3.87–5.11)
RDW: 17.6 % — ABNORMAL HIGH (ref 11.5–15.5)
WBC: 13.2 K/uL — ABNORMAL HIGH (ref 4.0–10.5)
nRBC: 0 % (ref 0.0–0.2)

## 2024-04-08 LAB — WET PREP, GENITAL
Clue Cells Wet Prep HPF POC: NONE SEEN
Sperm: NONE SEEN
Trich, Wet Prep: NONE SEEN
WBC, Wet Prep HPF POC: <10
Yeast Wet Prep HPF POC: NONE SEEN

## 2024-04-08 LAB — HIV ANTIBODY (ROUTINE TESTING W REFLEX): HIV Screen 4th Generation wRfx: NONREACTIVE

## 2024-04-08 LAB — HCG, QUANTITATIVE, PREGNANCY: hCG, Beta Chain, Quant, S: 10733 m[IU]/mL — ABNORMAL HIGH

## 2024-04-08 MED ORDER — ACETAMINOPHEN 500 MG PO TABS
1000.0000 mg | ORAL_TABLET | Freq: Once | ORAL | Status: AC
  Administered 2024-04-08: 1000 mg via ORAL
  Filled 2024-04-08: qty 2

## 2024-04-08 NOTE — MAU Note (Signed)
 Patient called from unit bathroom and reported passing a clot. RN notes moderate amount of bleeding and large clot in toilet. RN gave patient new pad and assisted patient to triage room.

## 2024-04-08 NOTE — MAU Provider Note (Signed)
 Chief Complaint:  Abdominal Pain and Vaginal Bleeding   HPI   None     Stacy Mcgee is a 29 y.o. G2P0101 at [redacted]w[redacted]d who presents to maternity admissions reporting vaginal bleeding and abdominal cramping. She reports cramping started yesterday, is currently 9/10. She has not taken any medication. Vaginal bleeding started today around 1600. She reports saturating 4 regular sized pads since then over the past 3.5 hours. She has passed clots. She reports a positive home pregnancy test.    Past Medical History:  Diagnosis Date   Adult body mass index 36.0-36.9 10/17/2012   Allergic rhinitis 10/24/2012   STORY: hx of med in past.`E1o3L`IMPRESSION: rx to pharmacy, to call if worsening     Asthma    Asthma 10/24/2012   STORY: seen friday in ER. [place]. `E1o3L`steroid pack, given spacer for her ventolin . `E1o3L`feeling much better overall, has tight cough.`E1o3L`IMPRESSION: spirometry today shows mild obstruction. `E1o3L`cont qvar bid, use ventolin  prn qid. call prn`E1o3L`discussed avoiding allergens, no smoking in house. con't claritin. f/u for CPE     Atopic dermatitis and related condition 10/24/2012   IMPRESSION: Severe.  Has no dermatologist, and has been using just Eucerin cream which has proven ineffective.  Rx of Triamcinolone  0.5% to use with Eucerin BID for one month and follow up at that time.  Add Zyrtec daily for allergic component.     Eczema    Generalized headaches    Irregular menstrual cycle 10/24/2012   Migraine without aura and without status migrainosus, not intractable 03/04/2015   Obesity 10/24/2012   IMPRESSION: discussed portions, food choices, avoid sugar drinks, increase exercise     Problems influencing health status 10/24/2012   Screening examination for STI 10/07/2022   Seasonal allergies    Sleeping difficulty 03/04/2015   Tension headache 03/04/2015   OB History  Gravida Para Term Preterm AB Living  2 1  1  1   SAB IAB Ectopic Multiple Live Births      0 1    # Outcome Date GA Lbr Len/2nd Weight Sex Type Anes PTL Lv  2 Current           1 Preterm 12/11/23 [redacted]w[redacted]d / 00:17 2460 g M Vag-Spont EPI  LIV   Past Surgical History:  Procedure Laterality Date   HAND SURGERY  02/2012   Family History  Problem Relation Age of Onset   Bipolar disorder Mother    Depression Mother    Migraines Father    ADD / ADHD Sister    Migraines Maternal Aunt    Cancer Maternal Grandfather    Heart Problems Paternal Grandfather    Social History[1] Allergies[2] Medications Prior to Admission  Medication Sig Dispense Refill Last Dose/Taking   albuterol  (VENTOLIN  HFA) 108 (90 Base) MCG/ACT inhaler Inhale into the lungs every 6 (six) hours as needed for wheezing or shortness of breath.   04/07/2024   budesonide -formoterol  (SYMBICORT ) 80-4.5 MCG/ACT inhaler Inhale 2 puffs into the lungs 2 (two) times daily. 1 each 3 Past Week   clobetasol  ointment (TEMOVATE ) 0.05 % Apply 1 Application topically 2 (two) times daily. 120 g 3 Past Week   cephALEXin  (KEFLEX ) 500 MG capsule Take 1 capsule (500 mg total) by mouth 2 (two) times daily for 7 days. 14 capsule 0    hydrOXYzine  (ATARAX ) 50 MG tablet Take 1 tablet (50 mg total) by mouth 3 (three) times daily as needed for itching. 90 tablet 0    ipratropium-albuterol  (DUONEB) 0.5-2.5 (3) MG/3ML SOLN Take 3  mLs by nebulization every 4 (four) hours as needed. 75 mL 0    levocetirizine (XYZAL ) 5 MG tablet Take 1 tablet (5 mg total) by mouth every evening. 90 tablet 3    potassium chloride  SA (KLOR-CON  M) 20 MEQ tablet Take 1 tablet (20 mEq total) by mouth daily. 90 tablet 3    predniSONE  (DELTASONE ) 10 MG tablet Take 6 tablets (60 mg total) by mouth daily with breakfast for 1 day, THEN 5 tablets (50 mg total) daily with breakfast for 1 day, THEN 4 tablets (40 mg total) daily with breakfast for 1 day, THEN 3 tablets (30 mg total) daily with breakfast for 1 day, THEN 2 tablets (20 mg total) daily with breakfast for 1 day, THEN 1  tablet (10 mg total) daily with breakfast for 1 day. 21 tablet 0     I have reviewed patient's Past Medical Hx, Surgical Hx, Family Hx, Social Hx, medications and allergies.   ROS  Pertinent items noted in HPI and remainder of comprehensive ROS otherwise negative.   PHYSICAL EXAM  Patient Vitals for the past 24 hrs:  BP Temp Temp src Pulse Resp SpO2 Height  04/08/24 1923 114/60 97.8 F (36.6 C) Oral 88 19 100 % 5' 3 (1.6 m)    Constitutional: Well-developed, well-nourished female in no acute distress.  HEENT: atraumatic, normocephalic. Neck has normal ROM. EOM intact. Cardiovascular: normal rate & rhythm, warm and well-perfused Respiratory: normal effort, no problems with respiration noted GI: Abd soft, non-tender, non-distended MSK: Extremities nontender, no edema, normal ROM Skin: warm and dry. Acyanotic, no jaundice or pallor. Neurologic: Alert and oriented x 4. No abnormal coordination. Psychiatric: Normal mood. Speech not slurred, not rapid/pressured. Patient is cooperative.  Labs: Results for orders placed or performed during the hospital encounter of 04/08/24 (from the past 24 hours)  Pregnancy, urine POC     Status: Abnormal   Collection Time: 04/08/24  7:22 PM  Result Value Ref Range   Preg Test, Ur POSITIVE (A) NEGATIVE  CBC     Status: Abnormal   Collection Time: 04/08/24  7:59 PM  Result Value Ref Range   WBC 13.2 (H) 4.0 - 10.5 K/uL   RBC 4.73 3.87 - 5.11 MIL/uL   Hemoglobin 11.8 (L) 12.0 - 15.0 g/dL   HCT 62.4 63.9 - 53.9 %   MCV 79.3 (L) 80.0 - 100.0 fL   MCH 24.9 (L) 26.0 - 34.0 pg   MCHC 31.5 30.0 - 36.0 g/dL   RDW 82.3 (H) 88.4 - 84.4 %   Platelets 454 (H) 150 - 400 K/uL   nRBC 0.0 0.0 - 0.2 %  hCG, quantitative, pregnancy     Status: Abnormal   Collection Time: 04/08/24  7:59 PM  Result Value Ref Range   hCG, Beta Chain, Quant, S 10,733 (H) <5 mIU/mL  Comprehensive metabolic panel with GFR     Status: Abnormal   Collection Time: 04/08/24  7:59 PM   Result Value Ref Range   Sodium 139 135 - 145 mmol/L   Potassium 3.7 3.5 - 5.1 mmol/L   Chloride 104 98 - 111 mmol/L   CO2 22 22 - 32 mmol/L   Glucose, Bld 80 70 - 99 mg/dL   BUN 5 (L) 6 - 20 mg/dL   Creatinine, Ser 9.36 0.44 - 1.00 mg/dL   Calcium  8.9 8.9 - 10.3 mg/dL   Total Protein 7.1 6.5 - 8.1 g/dL   Albumin 4.0 3.5 - 5.0 g/dL   AST 18 15 -  41 U/L   ALT 14 0 - 44 U/L   Alkaline Phosphatase 86 38 - 126 U/L   Total Bilirubin 0.3 0.0 - 1.2 mg/dL   GFR, Estimated >39 >39 mL/min   Anion gap 13 5 - 15  Wet prep, genital     Status: None   Collection Time: 04/08/24  8:11 PM   Specimen: PATH Cytology Cervicovaginal Ancillary Only  Result Value Ref Range   Yeast Wet Prep HPF POC NONE SEEN NONE SEEN   Trich, Wet Prep NONE SEEN NONE SEEN   Clue Cells Wet Prep HPF POC NONE SEEN NONE SEEN   WBC, Wet Prep HPF POC <10 <10   Sperm NONE SEEN    Imaging:  US  OB LESS THAN 14 WEEKS WITH OB TRANSVAGINAL Result Date: 04/08/2024 CLINICAL DATA:  Abdominal pain, vaginal bleeding EXAM: OBSTETRIC <14 WK US  AND TRANSVAGINAL OB US  TECHNIQUE: Both transabdominal and transvaginal ultrasound examinations were performed for complete evaluation of the gestation as well as the maternal uterus, adnexal regions, and pelvic cul-de-sac. Transvaginal technique was performed to assess early pregnancy. COMPARISON:  None Available. FINDINGS: Intrauterine gestational sac: None Yolk sac:  Not Visualized. Embryo:  Not Visualized. Maternal uterus/adnexae: Uterus is anteverted. No uterine masses. Endometrium measures up to 13 mm in thickness, without focal abnormality. Left ovary measures 2.7 x 4.1 x 3.1 cm. Right ovary measures 3.5 x 2.2 x 2.5 cm. There are no adnexal masses. Trace pelvic free fluid. IMPRESSION: 1. No evidence of intrauterine pregnancy at this time. Please correlate with quantitative beta HCG measurements. Serial beta HCG measurements and follow-up ultrasound may be useful to document live intrauterine  pregnancy and exclude ectopic pregnancy. 2. Trace pelvic free fluid. Electronically Signed   By: Ozell Daring M.D.   On: 04/08/2024 20:40    MDM & MAU COURSE  MDM: Moderate  MAU Course: -Vital signs within normal limits. -Ectopic rule out: CBC, US , beta-hCG. Blood type already on file - B Positive. -Wet prep and GC/chlamydia to rule out infectious causes.  -CBC stable. -Wet prep negative for infection. -US  negative for IUGS, YS, or FP. No adnexal masses. Imaging not definitive for SAB, cannot definitively rule out ectopic pregnancy. -hCG >10,000, would expect to see IUGS at this level so high suspicion for SAB. Will repeat hCG in 48 hours to confirm SAB with decreasing levels.  Differential diagnosis considered for 1st trimester vaginal bleeding includes but is not limited to: ectopic pregnancy, complete spontaneous abortion, incomplete abortion, missed abortion, threatened abortion, embryonic/fetal demise, cervical insufficiency, cervical or vaginal disorder   Orders Placed This Encounter  Procedures   Wet prep, genital   US  OB LESS THAN 14 WEEKS WITH OB TRANSVAGINAL   CBC   hCG, quantitative, pregnancy   HIV Antibody (routine testing w rflx)   RPR   Comprehensive metabolic panel with GFR   Diet NPO time specified   Pregnancy, urine POC   Discharge patient   Meds ordered this encounter  Medications   acetaminophen  (TYLENOL ) tablet 1,000 mg    ASSESSMENT   1. Vaginal bleeding     PLAN  Discharge home in stable condition with ectopic precautions.  Repeat hCG on 04/11/2024 in MAU to rule out ectopic pregnancy.   Follow-up Information     [facility] Follow up in 3 day(s).   Specialty: Obstetrics and Gynecology Why: repeat blood work Contact information: [employer] [place]  [id number] [phone]  Allergies as of 04/08/2024       Reactions   Peanut-containing Drug Products Anaphylaxis         Medication List     TAKE these medications    albuterol  108 (90 Base) MCG/ACT inhaler Commonly known as: VENTOLIN  HFA Inhale into the lungs every 6 (six) hours as needed for wheezing or shortness of breath.   budesonide -formoterol  80-4.5 MCG/ACT inhaler Commonly known as: SYMBICORT  Inhale 2 puffs into the lungs 2 (two) times daily.   cephALEXin  500 MG capsule Commonly known as: KEFLEX  Take 1 capsule (500 mg total) by mouth 2 (two) times daily for 7 days.   clobetasol  ointment 0.05 % Commonly known as: TEMOVATE  Apply 1 Application topically 2 (two) times daily.   hydrOXYzine  50 MG tablet Commonly known as: ATARAX  Take 1 tablet (50 mg total) by mouth 3 (three) times daily as needed for itching.   ipratropium-albuterol  0.5-2.5 (3) MG/3ML Soln Commonly known as: DUONEB Take 3 mLs by nebulization every 4 (four) hours as needed.   levocetirizine 5 MG tablet Commonly known as: XYZAL  Take 1 tablet (5 mg total) by mouth every evening.   potassium chloride  SA 20 MEQ tablet Commonly known as: KLOR-CON  M Take 1 tablet (20 mEq total) by mouth daily.   predniSONE  10 MG tablet Commonly known as: DELTASONE  Take 6 tablets (60 mg total) by mouth daily with breakfast for 1 day, THEN 5 tablets (50 mg total) daily with breakfast for 1 day, THEN 4 tablets (40 mg total) daily with breakfast for 1 day, THEN 3 tablets (30 mg total) daily with breakfast for 1 day, THEN 2 tablets (20 mg total) daily with breakfast for 1 day, THEN 1 tablet (10 mg total) daily with breakfast for 1 day. Start taking on: April 02, 2024        Joesph DELENA Sear, [place]     [1]  Social History Tobacco Use   Smoking status: Some Days    Current packs/day: 0.00    Types: Cigarettes    Last attempt to quit: 05/11/2023    Years since quitting: 0.9   Smokeless tobacco: Never   Tobacco comments:    4 cigarettes per day  Vaping Use   Vaping status: Never Used  Substance Use Topics   Alcohol use: Not Currently     Comment: occ   Drug use: Not Currently    Types: Marijuana    Comment: last use may 2025  [2]  Allergies Allergen Reactions   Peanut-Containing Drug Products Anaphylaxis

## 2024-04-08 NOTE — MAU Note (Signed)
..  Stacy Mcgee is a 29 y.o. at Unknown here in MAU reporting: abdominal cramping that began yesterday and vagina bleeding that began today around 4pm. Since then has saturated 4 regular sized pads. Patient reports that when she was in lobby bathroom to get urine sample she passed a large clot.  Has not taken medication for the pain.  LMP: 02/13/2024  Pain score: 9 Vitals:   04/08/24 1923  BP: 114/60  Pulse: 88  Resp: 19  Temp: 97.8 F (36.6 C)  SpO2: 100%

## 2024-04-08 NOTE — Discharge Instructions (Addendum)
 I am so sorry that this is happening. We will recheck your blood levels on Saturday morning to confirm if this is early pregnancy loss (miscarriage), a possible ectopic pregnancy, or bleeding for some other reason. There is nothing that you did or did not do to cause this to happen; it is not your fault. Please reach out if you need support, we are here for you. You can also join a group to help work through your grief: [email or web address]  EARLY PREGNANCY LOSS Once the egg is fertilized with the sperm and begins to develop, it attaches to the lining of the uterus. This early pregnancy tissue may not develop into an embryo (the beginning stage of a baby). Sometimes an embryo does develop but does not continue to grow. These problems can be seen on ultrasound.  About 1 in 4 women will have a pregnancy loss in her lifetime.  One in five pregnancies is found to be an early pregnancy loss.    WHAT TO EXPECT AS A PREGNANCY PASSES: Cramping   Bleeding Headaches   Dizziness To help with these symptoms, you can take Tylenol  1000 mg every 6 hours. You had your last dose at 8pm, and can repeat the dose at 2am if needed. Heating pads or warm baths/showers are also helpful for cramps.  Reasons to return to [facility] at [facility]: If you have heavier bleeding that soaks through more that 2 full-size pads per hour for an hour or more If you bleed so much that you feel like you might pass out or you do pass out If you feel lightheaded or dizzy If you have significant abdominal pain that is not improved with Tylenol  1000 mg every 6 hours as needed for pain If you develop a fever > 100.5  If you have foul-smelling vaginal discharge

## 2024-04-09 LAB — GC/CHLAMYDIA PROBE AMP (~~LOC~~) NOT AT ARMC
Chlamydia: NEGATIVE
Comment: NEGATIVE
Comment: NORMAL
Neisseria Gonorrhea: NEGATIVE

## 2024-04-09 LAB — SYPHILIS: RPR W/REFLEX TO RPR TITER AND TREPONEMAL ANTIBODIES, TRADITIONAL SCREENING AND DIAGNOSIS ALGORITHM: RPR Ser Ql: NONREACTIVE

## 2024-04-11 ENCOUNTER — Inpatient Hospital Stay (HOSPITAL_COMMUNITY): Admission: AD | Admit: 2024-04-11 | Source: Home / Self Care

## 2024-04-11 ENCOUNTER — Inpatient Hospital Stay (HOSPITAL_COMMUNITY)

## 2024-04-15 ENCOUNTER — Other Ambulatory Visit: Payer: Self-pay | Admitting: Medical Genetics
